# Patient Record
Sex: Female | Born: 1985 | Race: Black or African American | Hispanic: No | Smoking: Never smoker
Health system: Southern US, Community
[De-identification: ages and names within clinical notes are randomized; demographics above are authoritative.]

## PROBLEM LIST (undated history)

## (undated) DIAGNOSIS — Z789 Other specified health status: Secondary | ICD-10-CM

## (undated) HISTORY — PX: HAND SURGERY: SHX662

## (undated) HISTORY — PX: NO PAST SURGERIES: SHX2092

---

## 2011-05-30 ENCOUNTER — Encounter: Payer: Self-pay | Admitting: *Deleted

## 2011-05-30 ENCOUNTER — Emergency Department (HOSPITAL_COMMUNITY): Payer: Self-pay

## 2011-05-30 ENCOUNTER — Emergency Department (HOSPITAL_COMMUNITY)
Admission: EM | Admit: 2011-05-30 | Discharge: 2011-05-30 | Disposition: A | Discharge status: Home / Self Care | Payer: Self-pay | Attending: Emergency Medicine | Admitting: Emergency Medicine

## 2011-05-30 DIAGNOSIS — R109 Unspecified abdominal pain: Secondary | ICD-10-CM | POA: Insufficient documentation

## 2011-05-30 DIAGNOSIS — O200 Threatened abortion: Secondary | ICD-10-CM | POA: Insufficient documentation

## 2011-05-30 DIAGNOSIS — N898 Other specified noninflammatory disorders of vagina: Secondary | ICD-10-CM | POA: Insufficient documentation

## 2011-05-30 LAB — WET PREP, GENITAL: WBC, Wet Prep HPF POC: NONE SEEN

## 2011-05-30 LAB — POCT I-STAT, CHEM 8
Calcium, Ion: 1.23 mmol/L (ref 1.12–1.32)
Chloride: 103 mEq/L (ref 96–112)
Glucose, Bld: 88 mg/dL (ref 70–99)
HCT: 44 % (ref 36.0–46.0)
Hemoglobin: 15 g/dL (ref 12.0–15.0)
TCO2: 24 mmol/L (ref 0–100)

## 2011-05-30 LAB — HCG, QUANTITATIVE, PREGNANCY: hCG, Beta Chain, Quant, S: 2096 m[IU]/mL — ABNORMAL HIGH (ref <5)

## 2011-05-30 LAB — ABO/RH: ABO/RH(D): O POS

## 2011-05-30 NOTE — ED Provider Notes (Signed)
History     CSN: [id number] Arrival date & time: 05/30/2011 12:23 AM   First MD Initiated Contact with Patient 05/30/11 [phone]      Chief Complaint  Patient presents with  . Vaginal Bleeding    pt preports that vaginal bleed started on friday    (Consider location/radiation/quality/duration/timing/severity/associated sxs/prior treatment) Patient is a 25 y.o. female presenting with vaginal bleeding. The history is provided by the patient.  Vaginal Bleeding This is a new problem. The current episode started in the past 7 days (Last normal menstruation was late October and patient reports having a positive pregnancy test. If pregnant she is a G1P0.). The problem occurs constantly. The problem has been gradually improving. Associated symptoms include abdominal pain. Pertinent negatives include no nausea. The symptoms are aggravated by nothing. She has tried nothing for the symptoms.    History reviewed. No pertinent past medical history.  History reviewed. No pertinent past surgical history.  History reviewed. No pertinent family history.  History  Substance Use Topics  . Smoking status: Never Smoker   . Smokeless tobacco: Not on file  . Alcohol Use: No     once a year    OB History    Grav Para Term Preterm Abortions TAB SAB Ect Mult Living   1               Review of Systems  Constitutional: Negative.   Respiratory: Negative.   Cardiovascular: Negative.   Gastrointestinal: Positive for abdominal pain. Negative for nausea.  Genitourinary: Positive for vaginal bleeding and pelvic pain. Negative for dysuria, frequency, flank pain and vaginal discharge.    Allergies  Review of patient's allergies indicates no known allergies.  Home Medications  No current outpatient prescriptions on file.  BP 110/65  Pulse 99  Temp(Src) 97.8 F (36.6 C) (Oral)  Resp 16  SpO2 100%  LMP 04/11/2011  Physical Exam  Constitutional: She is oriented to person, place, and time. She  appears well-developed and well-nourished.  Neck: Normal range of motion.  Pulmonary/Chest: Effort normal.  Abdominal: Soft. Bowel sounds are normal.       Mild tenderness to lower abdomen without guarding.  Genitourinary: There is bleeding around the vagina.       Cervical os is closed. Bleeding with presence of possible POC. Nontender to bimanual exam.  Musculoskeletal: She exhibits no edema.  Neurological: She is alert and oriented to person, place, and time.  Skin: Skin is warm and dry.    ED Course  Procedures (including critical care time)  Labs Reviewed - No data to display No results found.   No diagnosis found.    MDM  US shows no IUC: early preg vs miscarriage vs ectopic. Discussed findings with patient. [facility] and further management.        Rodena Medin, PA 05/30/11 1220

## 2011-05-30 NOTE — ED Notes (Signed)
Pt in c/o vaginal bleeding since yesterday, pt states she is approx 2 months pregnant, states yesterday she was spotting, today she has saturated 3 pads, also c/o cramping abd pain

## 2011-05-30 NOTE — ED Provider Notes (Signed)
Medical screening examination/treatment/procedure(s) were performed by non-physician practitioner and as supervising physician I was immediately available for consultation/collaboration.  Flint Melter, MD 05/30/11 2033

## 2011-05-30 NOTE — ED Notes (Signed)
Currently in US

## 2011-05-30 NOTE — ED Notes (Signed)
Pt reports that s/s started on Friday, states bleeding moderte at this time, started on Friday as minimal bleed, pt changed period pads x3 during the day, and pain at worst 10/10 and at its best 2/10, pt states pain was sharp, constant when symptoms started but now reports minimal cramping, LMP 04/16/2011, pt reports that she had home test done and that she was positive for pregnancy. Pt states 1st pregnancy. Pt denies any other discomfort at this time.

## 2011-05-31 LAB — GC/CHLAMYDIA PROBE AMP, GENITAL
Chlamydia, DNA Probe: NEGATIVE
GC Probe Amp, Genital: NEGATIVE

## 2011-06-01 ENCOUNTER — Encounter (HOSPITAL_COMMUNITY): Payer: Self-pay

## 2011-06-01 ENCOUNTER — Ambulatory Visit (HOSPITAL_COMMUNITY): Payer: Self-pay

## 2011-06-01 ENCOUNTER — Inpatient Hospital Stay (HOSPITAL_COMMUNITY)
Admission: AD | Admit: 2011-06-01 | Discharge: 2011-06-01 | Disposition: A | Discharge status: Home / Self Care | Payer: Self-pay | Source: Ambulatory Visit | Attending: Family Medicine | Admitting: Family Medicine

## 2011-06-01 DIAGNOSIS — O209 Hemorrhage in early pregnancy, unspecified: Secondary | ICD-10-CM | POA: Insufficient documentation

## 2011-06-01 DIAGNOSIS — O4690 Antepartum hemorrhage, unspecified, unspecified trimester: Secondary | ICD-10-CM

## 2011-06-01 NOTE — ED Provider Notes (Signed)
Chart reviewed and agree with management and plan.  

## 2011-06-01 NOTE — Progress Notes (Signed)
Patient states she was seen at [facility] on 11-11. Was instructed to follow up at [facility] today. Patient states she is having no pain but having bleeding more than a period.

## 2011-06-01 NOTE — ED Provider Notes (Signed)
History     Chief Complaint  Patient presents with  . Follow-up    HPIDoreen Opoku25 y.o. presents for followup BHCG.  She was seen at [facility] on 11/11 for bleeding in early pregnancy.   On that date,  BHCG 2096 and ultrasound findings of No IUGS or adnexal mass.  She reports heavier  bleeding today without pain.    No past medical history on file.  No past surgical history on file.  No family history on file.  History  Substance Use Topics  . Smoking status: Never Smoker   . Smokeless tobacco: Not on file  . Alcohol Use: No     once a year    Allergies: No Known Allergies  No prescriptions prior to admission    Review of Systems  Constitutional: Negative.   HENT: Negative.   Gastrointestinal: Negative for abdominal pain.  Genitourinary:       + vaginal bleeding   Physical Exam   Blood pressure 120/73, pulse 81, temperature 98.5 F (36.9 C), temperature source Oral, resp. rate 16, height 5' 2.5" (1.588 m), weight 119 lb 12.8 oz (54.341 kg), last menstrual period 04/11/2011, SpO2 99.00%.  Physical Exam not indicated  Results for orders placed during the hospital encounter of 06/01/11 (from the past 24 hour(s))  HCG, QUANTITATIVE, PREGNANCY     Status: Abnormal   Collection Time   06/01/11  6:24 PM      Component Value Range   hCG, Beta Chain, Quant, S 1730 (*) <5 (mIU/mL)   BLOOD TYPE FROM PREVIOUS RECORD IS 0+  MAU Course  Procedures  MDM Discussed BHCGs with Dr. Shawnie Pons.  Will bring her back in 2 days for repeat BHCG and ectopic precautions  Instructed patient to return to MAU in 48 hrs for repeat bhcg, return sooner if severe abdominal pain and/or heavy vaginal bleeding.    Assessment and Plan  A:  Vaginal bleeding in early pregnancy  P:  Repeat BHCG in 48 hrs. Matt Holmes 06/01/2011, 6:31 PM   Matt Holmes, NP 06/01/11 1911  Matt Holmes, NP 06/01/11 1912

## 2011-06-03 ENCOUNTER — Inpatient Hospital Stay (HOSPITAL_COMMUNITY)
Admission: AD | Admit: 2011-06-03 | Discharge: 2011-06-03 | Disposition: A | Discharge status: Home / Self Care | Payer: Self-pay | Source: Ambulatory Visit | Attending: Obstetrics & Gynecology | Admitting: Obstetrics & Gynecology

## 2011-06-03 DIAGNOSIS — O034 Incomplete spontaneous abortion without complication: Secondary | ICD-10-CM | POA: Insufficient documentation

## 2011-06-03 NOTE — Progress Notes (Signed)
Pt G1 at 7.4wks in for follow up for miscarriage.  Pt denies pain.  Reports bleeding, no clots.

## 2011-06-03 NOTE — ED Provider Notes (Signed)
History     Chief Complaint  Patient presents with  . Follow-up   HPI 25 y.o. G1P0 here for f/u quant. Seen at [facility] on 11/11 with vaginal bleeding and + UPT, quant 2096, no IUP, no adnexal mass on u/s. Here for quant on 11/13, decreased to 1730. Still having bleeding, somewhat lighter now, no pain.     No past medical history on file.  No past surgical history on file.  No family history on file.  History  Substance Use Topics  . Smoking status: Never Smoker   . Smokeless tobacco: Not on file  . Alcohol Use: No     once a year    Allergies: No Known Allergies  No prescriptions prior to admission    Review of Systems  Gastrointestinal: Negative for abdominal pain.  Genitourinary:       + bleeding    Physical Exam   Blood pressure 106/69, pulse 76, temperature 98.1 F (36.7 C), temperature source Oral, resp. rate 16, height 5\' 2"  (1.575 m), weight 54.885 kg (121 lb), last menstrual period 04/11/2011.  Physical Exam  Nursing note and vitals reviewed. Constitutional: She is oriented to person, place, and time. She appears well-developed and well-nourished. No distress.  Cardiovascular: Normal rate.   Respiratory: Effort normal.  Neurological: She is alert and oriented to person, place, and time.  Skin: Skin is warm and dry.  Psychiatric: She has a normal mood and affect.    MAU Course  Procedures  Results for orders placed during the hospital encounter of 06/03/11 (from the past 24 hour(s))  HCG, QUANTITATIVE, PREGNANCY     Status: Abnormal   Collection Time   06/03/11 11:00 PM      Component Value Range   hCG, Beta Chain, Quant, S 1644 (*) <5 (mIU/mL)     Assessment and Plan  HCG continuing to decrease, repeat in 2 days, rev'd precautions Consulted with Dr. Debroah Loop, agrees with plan   [facility] 06/04/2011, 2:12 AM

## 2011-06-06 ENCOUNTER — Ambulatory Visit (HOSPITAL_COMMUNITY)
Admit: 2011-06-06 | Discharge: 2011-06-06 | Disposition: A | Discharge status: Home / Self Care | Payer: Self-pay | Attending: Obstetrics & Gynecology | Admitting: Obstetrics & Gynecology

## 2011-06-06 ENCOUNTER — Inpatient Hospital Stay (HOSPITAL_COMMUNITY)
Admission: AD | Admit: 2011-06-06 | Discharge: 2011-06-06 | Disposition: A | Discharge status: Home / Self Care | Payer: Self-pay | Source: Ambulatory Visit | Attending: Obstetrics & Gynecology | Admitting: Obstetrics & Gynecology

## 2011-06-06 ENCOUNTER — Inpatient Hospital Stay (HOSPITAL_COMMUNITY): Payer: Self-pay

## 2011-06-06 DIAGNOSIS — O009 Unspecified ectopic pregnancy without intrauterine pregnancy: Secondary | ICD-10-CM | POA: Diagnosis present

## 2011-06-06 DIAGNOSIS — O00109 Unspecified tubal pregnancy without intrauterine pregnancy: Secondary | ICD-10-CM | POA: Insufficient documentation

## 2011-06-06 LAB — CBC
HCT: 39 % (ref 36.0–46.0)
MCH: 30.4 pg (ref 26.0–34.0)
MCV: 89.9 fL (ref 78.0–100.0)
RDW: 12.5 % (ref 11.5–15.5)
WBC: 5.9 10*3/uL (ref 4.0–10.5)

## 2011-06-06 LAB — CREATININE, SERUM: Creatinine, Ser: 0.65 mg/dL (ref 0.50–1.10)

## 2011-06-06 MED ORDER — METHOTREXATE INJECTION FOR WOMEN'S HOSPITAL
50.0000 mg/m2 | Freq: Once | INTRAMUSCULAR | Status: AC
  Administered 2011-06-06: 80 mg via INTRAMUSCULAR
  Filled 2011-06-06: qty 1.6

## 2011-06-06 NOTE — ED Provider Notes (Signed)
Agree with above note.  Lindzie Boxx H. 06/06/2011 9:24 PM

## 2011-06-06 NOTE — Progress Notes (Signed)
Bleeding stopped yesterday still having a little bit of abdominal pain, here for BHCG

## 2011-06-06 NOTE — ED Provider Notes (Signed)
History     Chief Complaint  Patient presents with  . Abdominal Pain   Abdominal Pain   25 y.o. G1P0 here for f/u quant. Seen at [facility] on 11/11 with vaginal bleeding and + UPT, quant 2096, no IUP, no adnexal mass on u/s. Here for quant on 11/13, decreased to 1730, then 1644 on 11/15. Today bleeding has stopped, but still having some mild low abd pain.    No past medical history on file.  No past surgical history on file.  No family history on file.  History  Substance Use Topics  . Smoking status: Never Smoker   . Smokeless tobacco: Not on file  . Alcohol Use: No     once a year    Allergies: No Known Allergies  No prescriptions prior to admission    Review of Systems  Gastrointestinal: Positive for abdominal pain.  Genitourinary:       Negative for bleeding    Physical Exam   Blood pressure 106/69, pulse 91, temperature 99 F (37.2 C), temperature source Oral, resp. rate 16, height 5' 3.5" (1.613 m), weight 53.524 kg (118 lb), last menstrual period 04/11/2011.  Physical Exam  Nursing note and vitals reviewed. Constitutional: She is oriented to person, place, and time. She appears well-developed and well-nourished. No distress.  Cardiovascular: Normal rate.   Respiratory: Effort normal.  Neurological: She is alert and oriented to person, place, and time.  Skin: Skin is warm and dry.  Psychiatric: She has a normal mood and affect.    MAU Course  Procedures   Results for orders placed during the hospital encounter of 06/06/11 (from the past 24 hour(s))  HCG, QUANTITATIVE, PREGNANCY     Status: Abnormal   Collection Time   06/06/11 12:39 PM      Component Value Range   hCG, Beta Chain, Quant, S 1257 (*) <5 (mIU/mL)  CBC     Status: Normal   Collection Time   06/06/11 12:39 PM      Component Value Range   WBC 5.9  4.0 - 10.5 (K/uL)   RBC 4.34  3.87 - 5.11 (MIL/uL)   Hemoglobin 13.2  12.0 - 15.0 (g/dL)   HCT 78.2  95.6 - 21.3 (%)   MCV 89.9  78.0 -  100.0 (fL)   MCH 30.4  26.0 - 34.0 (pg)   MCHC 33.8  30.0 - 36.0 (g/dL)   RDW 08.6  57.8 - 46.9 (%)   Platelets 274  150 - 400 (K/uL)   Consulted with Dr. Penne Lash, will get repeat u/s to verify no ectopic pregnancy.   U/S: 2.5 cm right adnexal mass suspicious for ectopic, no IUP   Assessment and Plan  25 y.o. G1P0 with right ectopic pregnancy Methotrexate today, f/u per protocol, precautions rev'd  Iriel Nason 06/06/2011, 2:36 PM

## 2011-06-09 ENCOUNTER — Inpatient Hospital Stay (HOSPITAL_COMMUNITY)
Admission: AD | Admit: 2011-06-09 | Discharge: 2011-06-09 | Disposition: A | Discharge status: Home / Self Care | Payer: Self-pay | Source: Ambulatory Visit | Attending: Obstetrics & Gynecology | Admitting: Obstetrics & Gynecology

## 2011-06-09 ENCOUNTER — Encounter (HOSPITAL_COMMUNITY): Payer: Self-pay | Admitting: *Deleted

## 2011-06-09 DIAGNOSIS — O00109 Unspecified tubal pregnancy without intrauterine pregnancy: Secondary | ICD-10-CM | POA: Insufficient documentation

## 2011-06-09 DIAGNOSIS — O009 Unspecified ectopic pregnancy without intrauterine pregnancy: Secondary | ICD-10-CM

## 2011-06-09 HISTORY — DX: Other specified health status: Z78.9

## 2011-06-09 NOTE — ED Provider Notes (Signed)
History   Pt presents today for B-quant hcg on day 4 s/p methotrexate for ectopic preg. She states she is doing well and has no complaints. She denies abd pain, vag dc, bleeding, or any other sx at this time.  No chief complaint on file.  HPI  OB History    Grav Para Term Preterm Abortions TAB SAB Ect Mult Living   1               Past Medical History  Diagnosis Date  . No pertinent past medical history     Past Surgical History  Procedure Date  . No past surgeries     Family History  Problem Relation Age of Onset  . Anesthesia problems Neg Hx     History  Substance Use Topics  . Smoking status: Never Smoker   . Smokeless tobacco: Never Used  . Alcohol Use: No     once a year    Allergies: No Known Allergies  No prescriptions prior to admission    Review of Systems  Constitutional: Negative for fever.  Cardiovascular: Negative for chest pain.  Gastrointestinal: Negative for nausea, vomiting, abdominal pain, diarrhea and constipation.  Genitourinary: Negative for dysuria, urgency, frequency and hematuria.  Neurological: Negative for dizziness and headaches.  Psychiatric/Behavioral: Negative for depression and suicidal ideas.   Physical Exam   Blood pressure 121/84, pulse 85, temperature 98.2 F (36.8 C), temperature source Oral, resp. rate 18, last menstrual period 04/11/2011, SpO2 99.00%.  Physical Exam  Nursing note and vitals reviewed. Constitutional: She is oriented to person, place, and time. She appears well-developed and well-nourished. No distress.  HENT:  Head: Normocephalic and atraumatic.  Eyes: EOM are normal. Pupils are equal, round, and reactive to light.  GI: Soft. She exhibits no distension. There is no tenderness. There is no rebound and no guarding.  Neurological: She is alert and oriented to person, place, and time.  Skin: Skin is warm and dry. She is not diaphoretic.  Psychiatric: Her behavior is normal. Judgment and thought content  normal.    MAU Course  Procedures  Results for orders placed during the hospital encounter of 06/09/11 (from the past 24 hour(s))  HCG, QUANTITATIVE, PREGNANCY     Status: Abnormal   Collection Time   06/09/11 12:43 PM      Component Value Range   hCG, Beta Chain, Quant, S 658 (*) <5 (mIU/mL)     Assessment and Plan  Ectopic preg: discussed with pt at length. She will return on 06/12/11 for day 7 B-quant hcg. Discussed signs and sx of ruptured ectopic at length. Discussed diet, activity, risks, and precautions.  Clinton Gallant. Khaliya Golinski III, DrHSc, MPAS, PA-C  06/09/2011, 1:24 PM   Henrietta Hoover, PA 06/09/11 1329

## 2011-06-12 ENCOUNTER — Encounter (HOSPITAL_COMMUNITY): Payer: Self-pay | Admitting: *Deleted

## 2011-06-12 ENCOUNTER — Inpatient Hospital Stay (HOSPITAL_COMMUNITY)
Admission: AD | Admit: 2011-06-12 | Discharge: 2011-06-12 | Disposition: A | Discharge status: Home / Self Care | Payer: Self-pay | Source: Ambulatory Visit | Attending: Obstetrics and Gynecology | Admitting: Obstetrics and Gynecology

## 2011-06-12 DIAGNOSIS — O00109 Unspecified tubal pregnancy without intrauterine pregnancy: Secondary | ICD-10-CM | POA: Insufficient documentation

## 2011-06-12 DIAGNOSIS — O009 Unspecified ectopic pregnancy without intrauterine pregnancy: Secondary | ICD-10-CM

## 2011-06-12 NOTE — Progress Notes (Signed)
Pt states, " I am here for my recheck on a threatened miscarriage. My bleeding is less and I do not have pain."

## 2011-06-12 NOTE — ED Provider Notes (Signed)
Agree with above note.  Tobias Avitabile 06/12/2011 10:39 PM

## 2011-06-12 NOTE — ED Provider Notes (Signed)
History     Chief Complaint  Patient presents with  . Follow-up   HPI  Pt is here day #7 status post methotrexate on 06/06/11.  Pt denies abdominal pain; +slight bleeding.  No questions or concerns.    Past Medical History  Diagnosis Date  . No pertinent past medical history     Past Surgical History  Procedure Date  . No past surgeries     Family History  Problem Relation Age of Onset  . Anesthesia problems Neg Hx     History  Substance Use Topics  . Smoking status: Never Smoker   . Smokeless tobacco: Never Used  . Alcohol Use: No     once a year    Allergies: No Known Allergies  No prescriptions prior to admission    Review of Systems  Genitourinary:       Vaginal bleeding  All other systems reviewed and are negative.   Physical Exam   Blood pressure 103/59, pulse 79, temperature 98.8 F (37.1 C), temperature source Oral, resp. rate 20, height 5' 2.25" (1.581 m), weight 53.071 kg (117 lb), last menstrual period 04/11/2011, SpO2 99.00%.  Physical Exam  Constitutional: She is oriented to person, place, and time. She appears well-developed and well-nourished. No distress.  HENT:  Head: Normocephalic.  Neck: Normal range of motion. Neck supple.  Neurological: She is alert and oriented to person, place, and time. She has normal reflexes.  Skin: Skin is warm and dry.    MAU Course  Procedures BHCG 285  Assessment and Plan  Ectopic s/p Methotrexate  Plan: DC to home Return in one week for repeat of BHCG  [facility] 06/12/2011, 9:20 PM

## 2011-06-19 ENCOUNTER — Inpatient Hospital Stay (HOSPITAL_COMMUNITY)
Admission: AD | Admit: 2011-06-19 | Discharge: 2011-06-19 | Disposition: A | Discharge status: Home / Self Care | Payer: Self-pay | Source: Ambulatory Visit | Attending: Obstetrics and Gynecology | Admitting: Obstetrics and Gynecology

## 2011-06-19 ENCOUNTER — Ambulatory Visit (HOSPITAL_COMMUNITY): Payer: Self-pay

## 2011-06-19 DIAGNOSIS — Z3400 Encounter for supervision of normal first pregnancy, unspecified trimester: Secondary | ICD-10-CM

## 2011-06-19 DIAGNOSIS — O00109 Unspecified tubal pregnancy without intrauterine pregnancy: Secondary | ICD-10-CM | POA: Insufficient documentation

## 2011-06-19 NOTE — Progress Notes (Signed)
Patient to MAU for repeat BHCG. States she is not having any pain or spotting.

## 2011-07-08 ENCOUNTER — Inpatient Hospital Stay (HOSPITAL_COMMUNITY)
Admission: AD | Admit: 2011-07-08 | Discharge: 2011-07-09 | Disposition: A | Discharge status: Home / Self Care | Payer: Self-pay | Source: Ambulatory Visit | Attending: Obstetrics & Gynecology | Admitting: Obstetrics & Gynecology

## 2011-07-08 DIAGNOSIS — Z8742 Personal history of other diseases of the female genital tract: Secondary | ICD-10-CM

## 2011-07-08 DIAGNOSIS — O00109 Unspecified tubal pregnancy without intrauterine pregnancy: Secondary | ICD-10-CM | POA: Insufficient documentation

## 2011-07-08 DIAGNOSIS — Z8759 Personal history of other complications of pregnancy, childbirth and the puerperium: Secondary | ICD-10-CM

## 2011-07-08 NOTE — ED Provider Notes (Signed)
History     No chief complaint on file.  HPI 25 y.o. G1P0 here for f/u quant. Diagnosed with ectopic pregnancy and treated with methotrexate on 11/11. Quant decreased to 29 on 12/1, states she was told to come back today for f/u. No pain or bleeding.     Past Medical History  Diagnosis Date  . No pertinent past medical history     Past Surgical History  Procedure Date  . No past surgeries     Family History  Problem Relation Age of Onset  . Anesthesia problems Neg Hx     History  Substance Use Topics  . Smoking status: Never Smoker   . Smokeless tobacco: Never Used  . Alcohol Use: No     once a year    Allergies: No Known Allergies  No prescriptions prior to admission    Review of Systems  Constitutional: Negative.   Respiratory: Negative.   Cardiovascular: Negative.   Gastrointestinal: Negative for nausea, vomiting, abdominal pain, diarrhea and constipation.  Genitourinary: Negative for dysuria, urgency, frequency, hematuria and flank pain.       Negative for vaginal bleeding, vaginal discharge  Musculoskeletal: Negative.   Neurological: Negative.   Psychiatric/Behavioral: Negative.    Physical Exam   Last menstrual period 04/11/2011.  Physical Exam  Nursing note and vitals reviewed. Constitutional: She is oriented to person, place, and time. No distress.  Neurological: She is alert and oriented to person, place, and time.  Psychiatric: She has a normal mood and affect.    MAU Course  Procedures  Results for orders placed during the hospital encounter of 07/08/11 (from the past 24 hour(s))  HCG, QUANTITATIVE, PREGNANCY     Status: Normal   Collection Time   07/08/11 11:45 PM      Component Value Range   hCG, Beta Chain, Quant, S <1  <5 (mIU/mL)     Assessment and Plan  S/P ectopic pregnancy with MTX treatment No further f/u needed  Anna Wolfe 07/08/2011, 11:29 PM

## 2011-07-08 NOTE — Progress Notes (Signed)
PT SAYS SHE IS FOLLOWUP FOR METHOTREXATE-   NO BLEEDING - NO PAIN.  WAS SUPPOSE TO COME IN ON Sunday- BUT WAS OUT OF TOWN.

## 2011-07-20 NOTE — ED Provider Notes (Signed)
Agree with above note.  Anna Wolfe H. 07/20/2011 9:21 PM

## 2012-08-30 IMAGING — US US OB COMP LESS 14 WK
1 series · 13 of 28 positions shown · non-contrast
Comparison: None.

CLINICAL DATA: Pregnant, bleeding, beta HCG 6938, 7 weeks 3 days by
LMP

OBSTETRIC <14 WK US AND TRANSVAGINAL OB US
TECHNIQUE: Both transabdominal and transvaginal ultrasound
examinations were performed for complete evaluation of the
gestation as well as the maternal uterus, adnexal regions, and
pelvic cul-de-sac.  Transvaginal technique was performed to assess
early pregnancy.

[Series 1: us ob comp less 14 wk · 0.28mm/px · 13 of 45 slices shown]
[im 2/45]
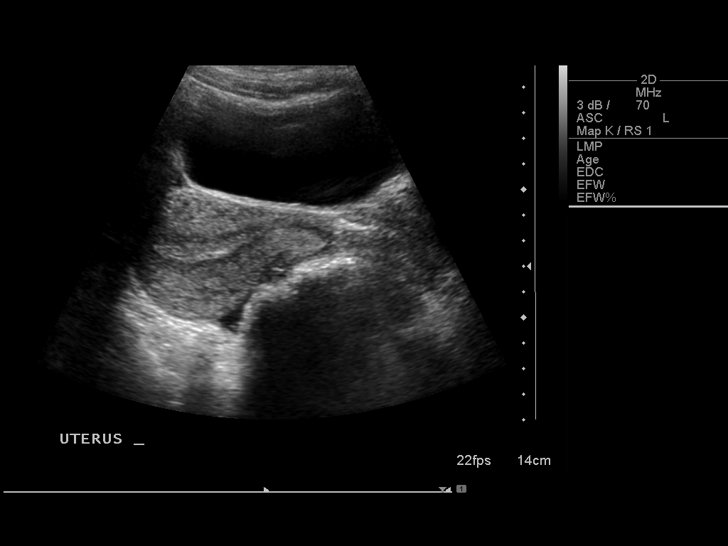
[im 5/45]
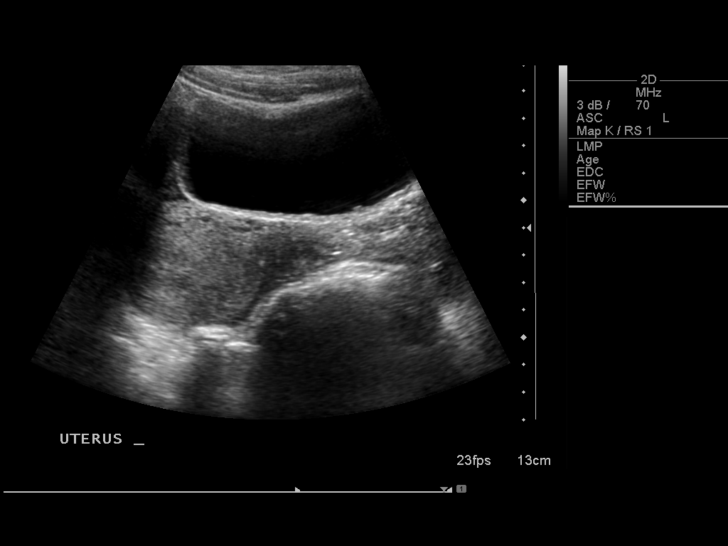
[im 9/45]
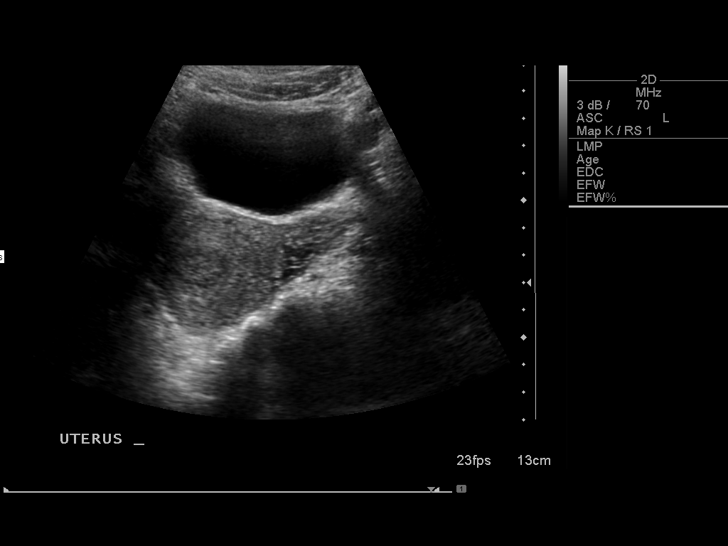
[im 12/45]
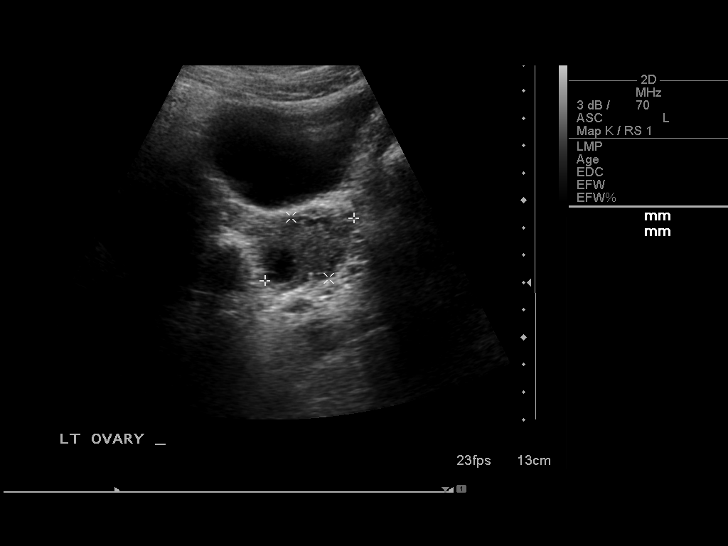
[im 15/45]
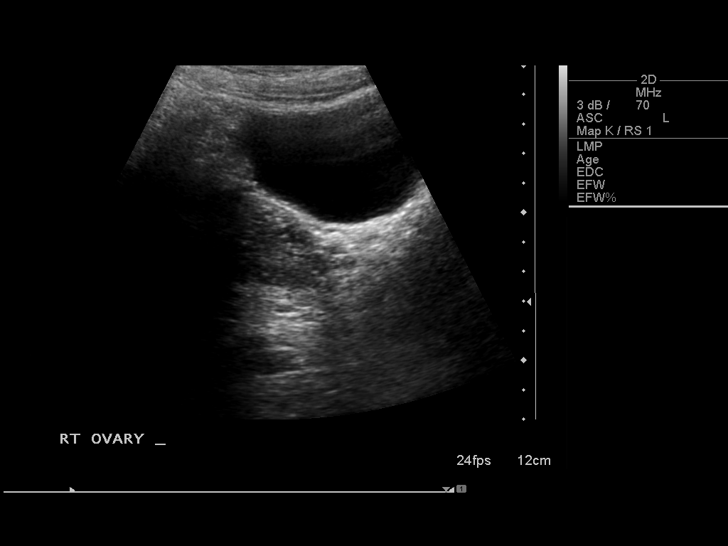
[im 18/45]
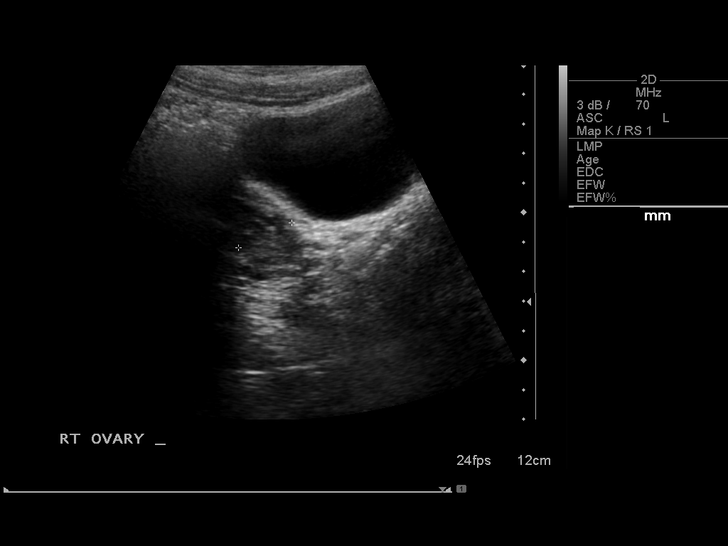
[im 23/45]
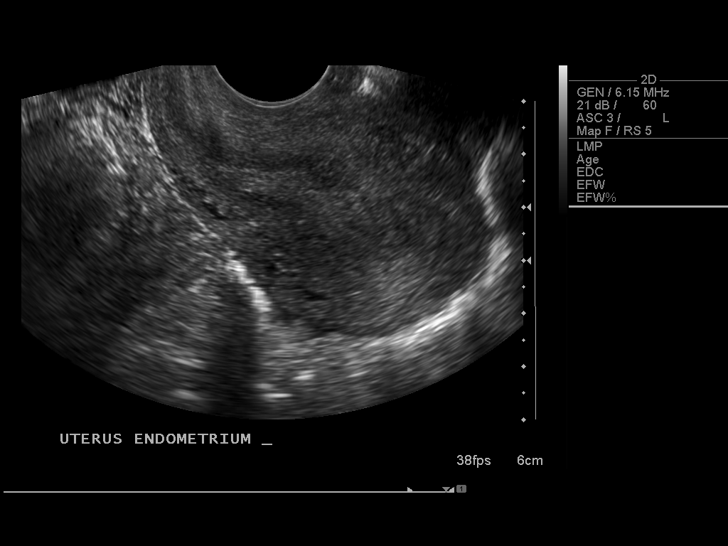
[im 27/45]
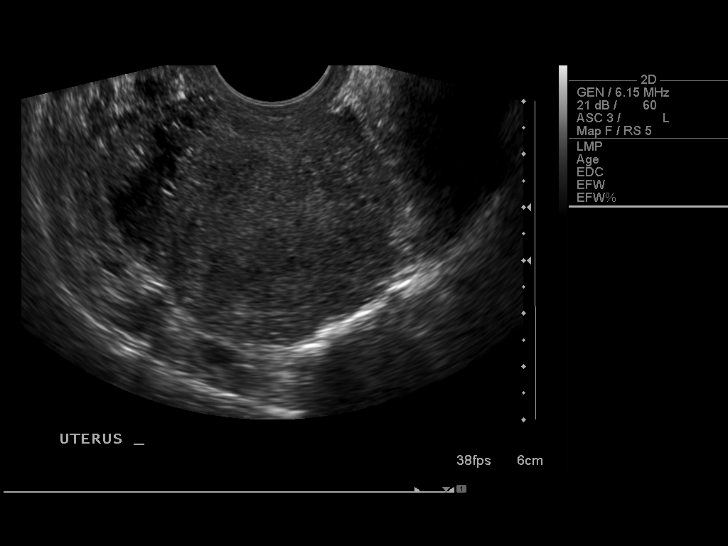
[im 30/45]
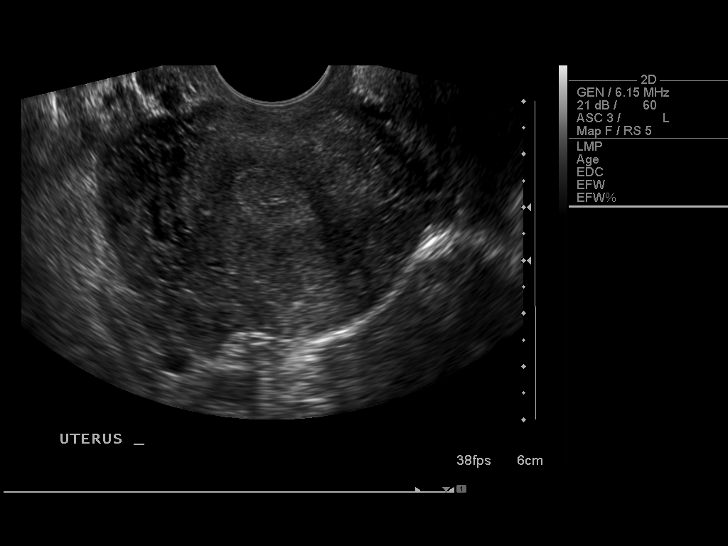
[im 33/45]
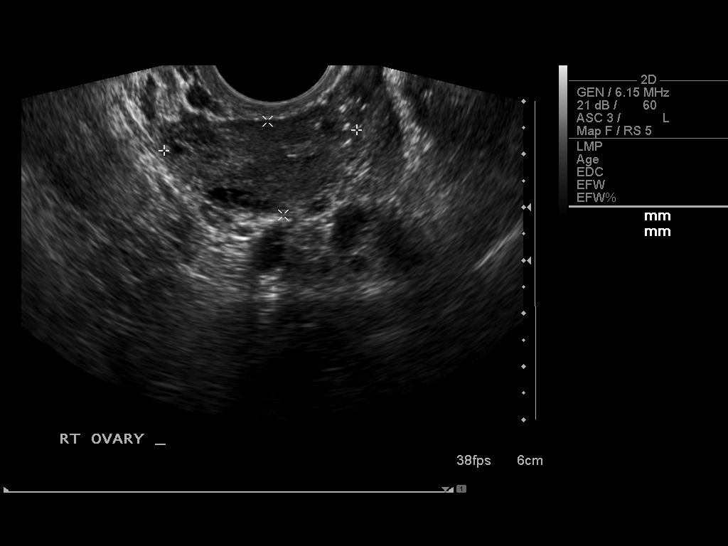
[im 36/45]
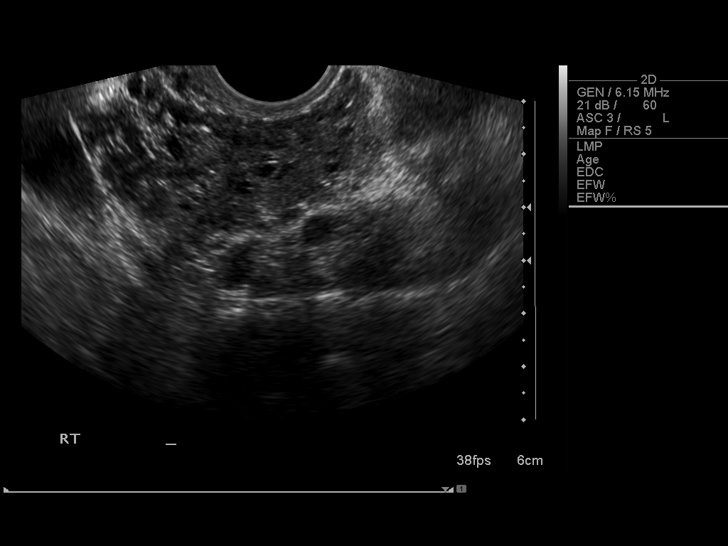
[im 40/45]
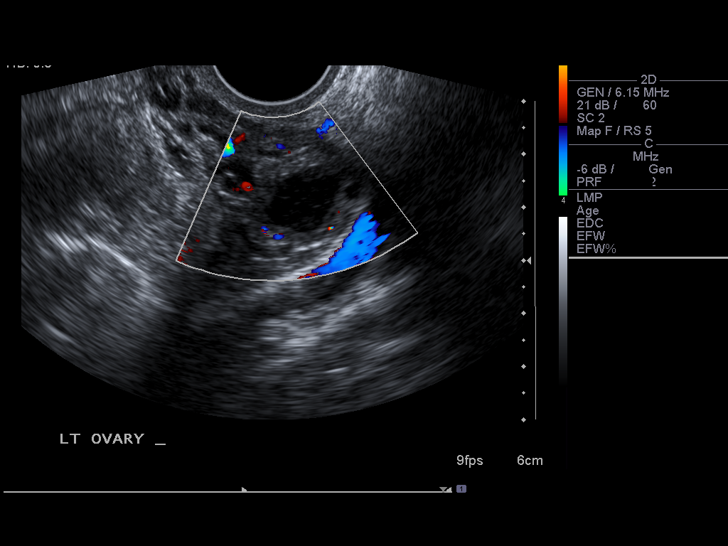
[im 43/45]
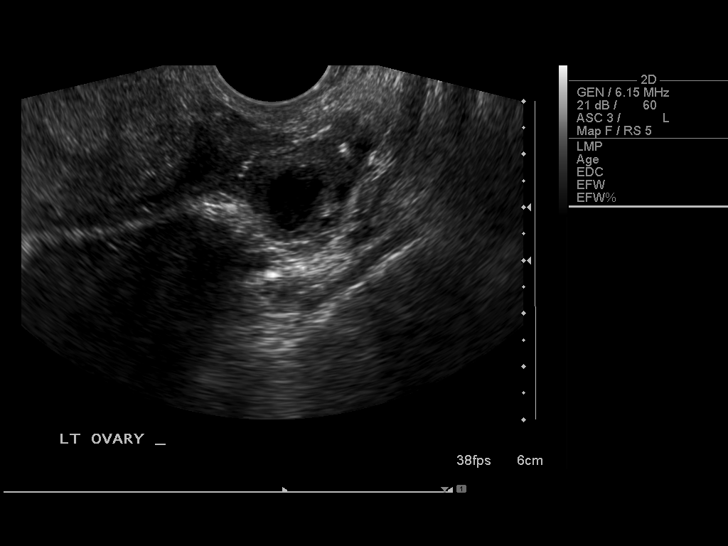

[13 of 28 positions shown; findings below may reference images not displayed]

Intrauterine gestational sac:  Not visualized.
Yolk sac: Not visualized.
Embryo: Not visualized.

Maternal uterus/adnexae:
Endometrium is heterogeneous and measures 12 mm in thickness.

Right ovary measures 1.8 x 2.0 x 3.7 cm and is within normal
limits.

Left ovary measures 3.7 x 1.7 x 1.7 cm and is notable for a corpus
luteal cyst.

No adnexal masses are seen.

Small volume pelvic fluid.
IMPRESSION: No intrauterine gestational sac is visualized.  Endometrium
measures 12 mm.  No adnexal mass is seen.

By definition, this reflects a pregnancy of unknown location.
Differential considerations include early normal IUP, abnormal IUP,
or nonvisualized ectopic pregnancy.  Given the clinical history and
beta HCG, this is favored to reflect an abnormal IUP.  However,
serial beta HCG (supplemented by repeat sonography as clinically
warranted) is suggested.

Suspected corpus luteal cyst on the left.

## 2012-09-06 IMAGING — US US OB TRANSVAGINAL
1 series · 14 of 28 positions shown · non-contrast
Comparison: none

[Series 1: us ob transvaginal · 37 acquisitions, 14 frames shown]
[im 2/37]
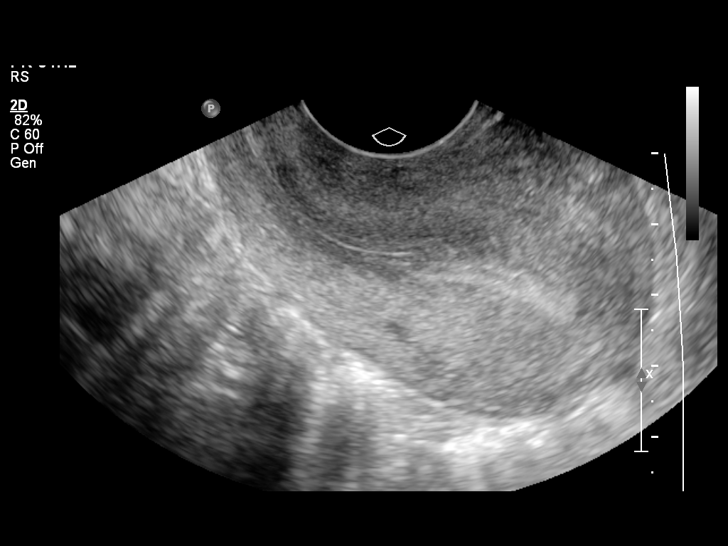
[im 5/37]
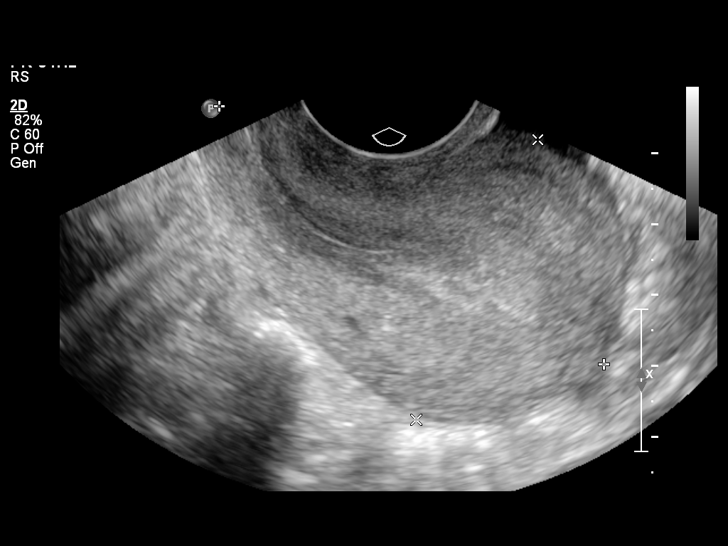
[im 7/37]
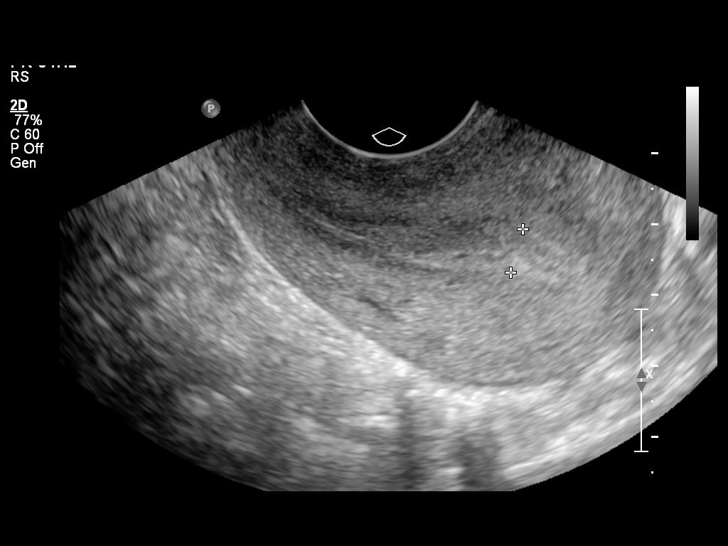
[im 10/37]
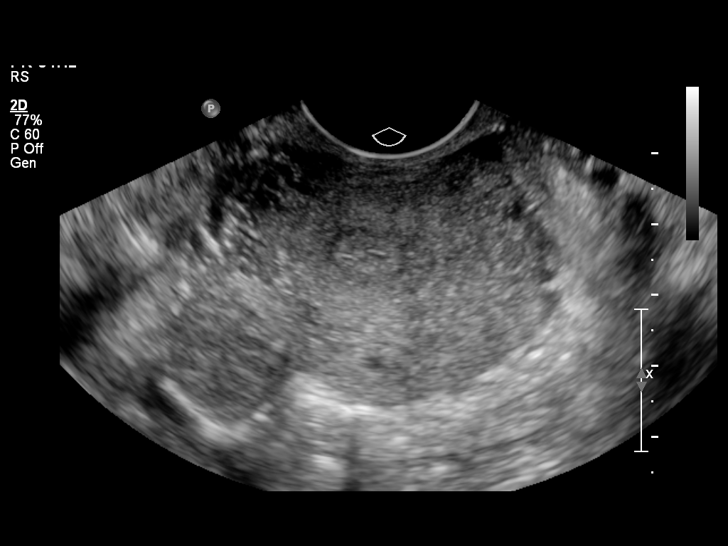
[im 13/37]
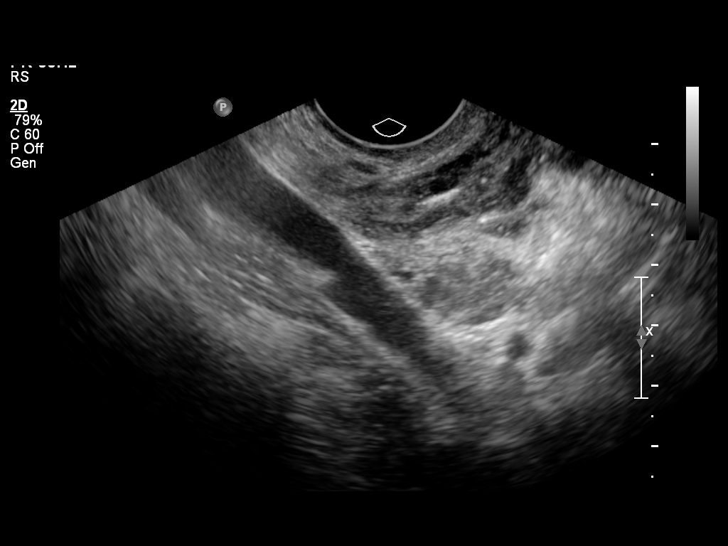
[im 15/37]
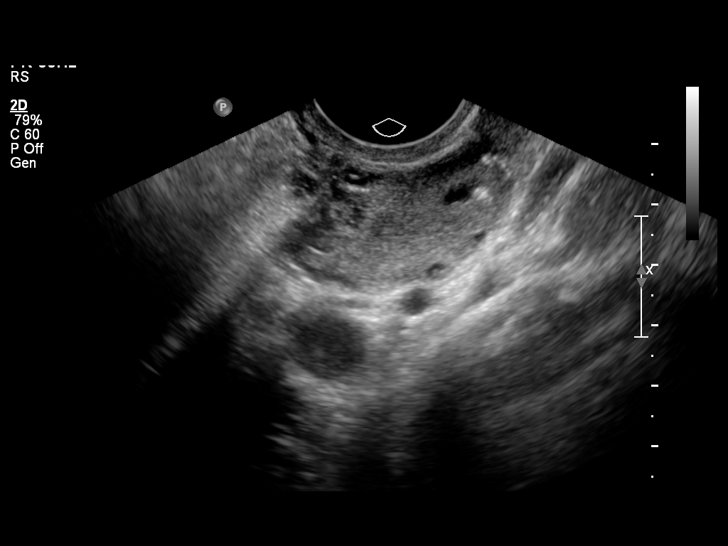
[im 18/37]
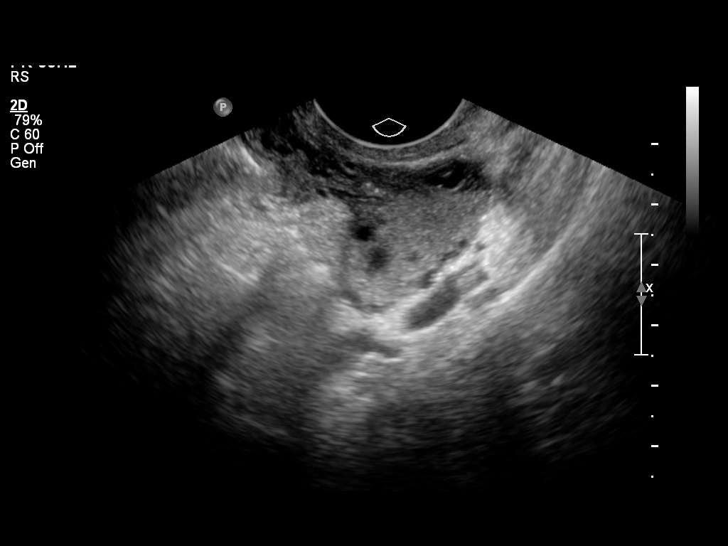
[im 21/37]
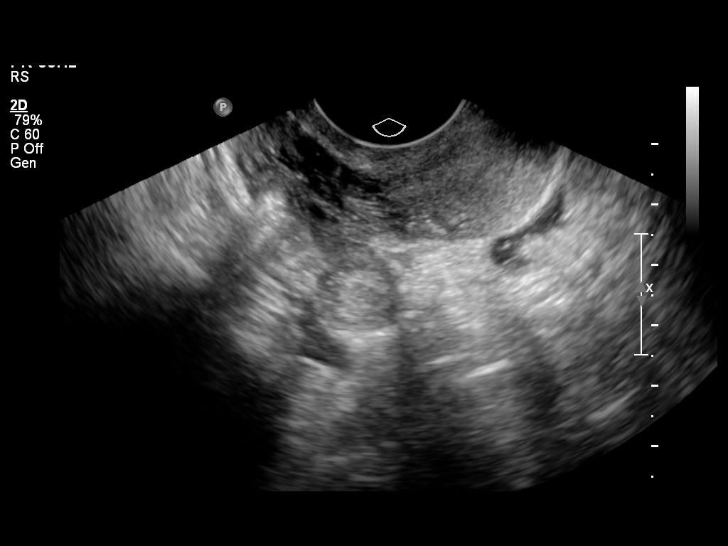
[im 23/37]
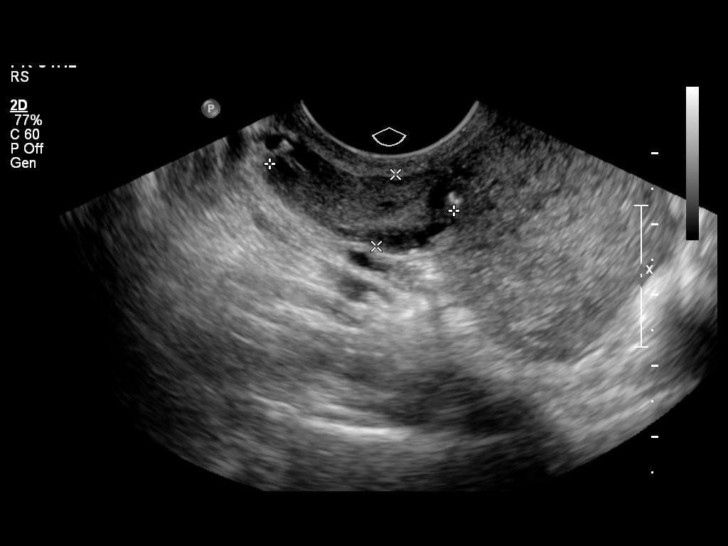
[im 26/37]
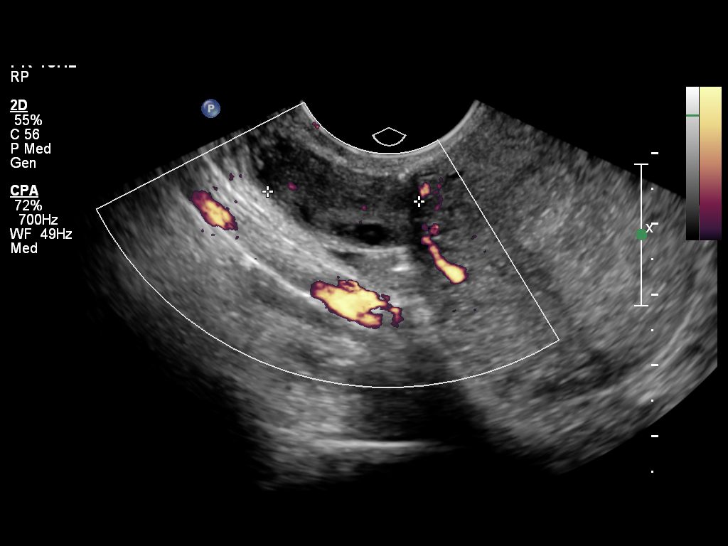
[im 29/37]
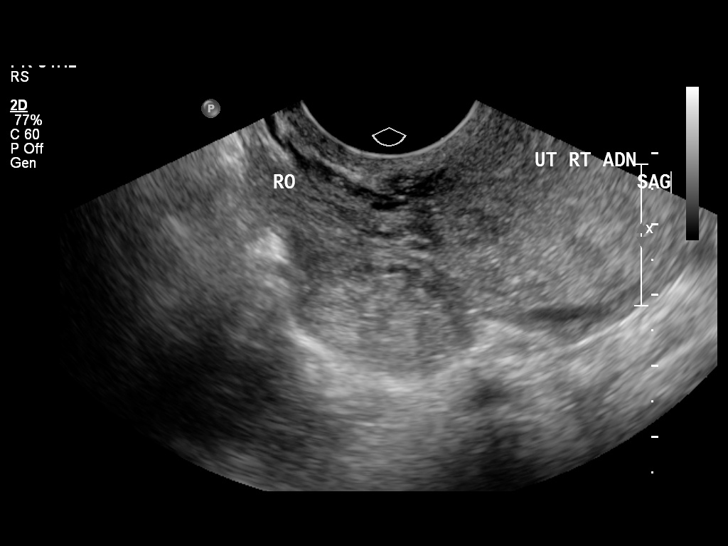
[im 31/37]
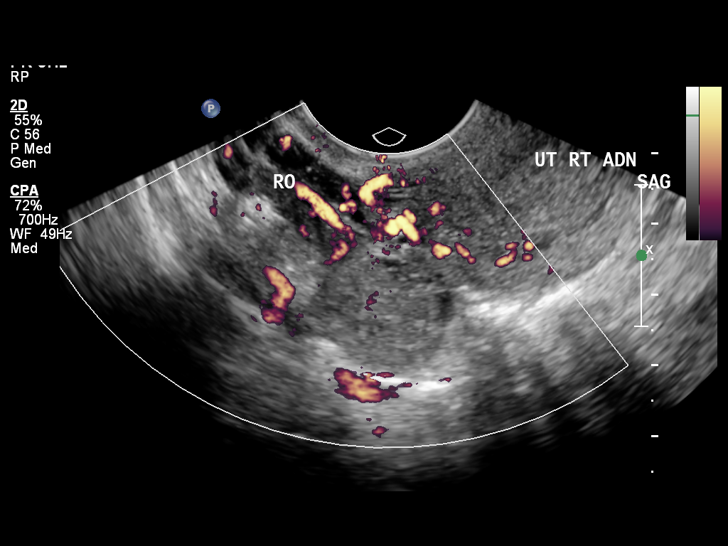
[im 34/37]
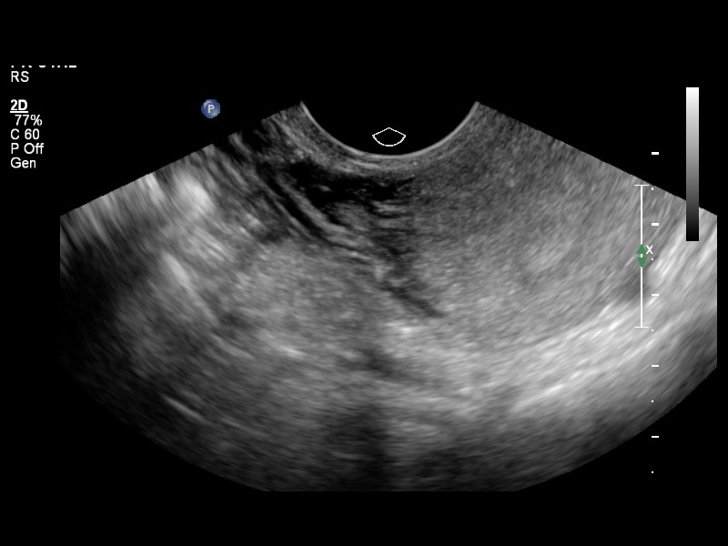
[im 37/37]
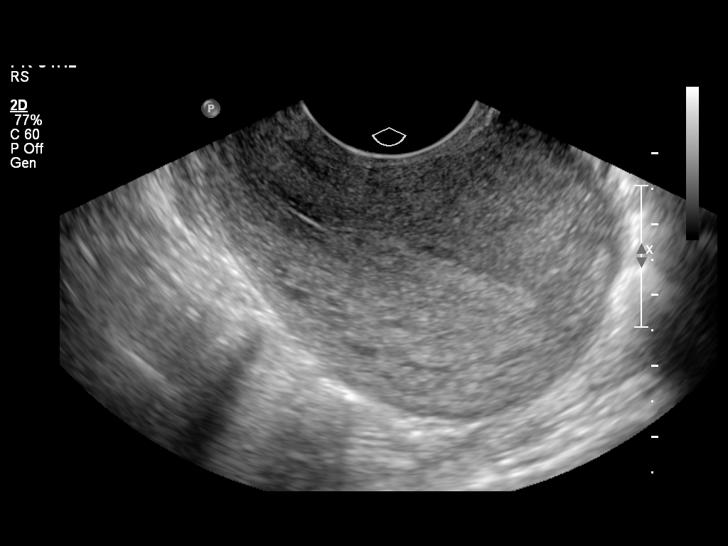

[14 of 28 positions shown; findings below may reference images not displayed]

OBSTETRICS REPORT
                      (Signed Final 06/06/2011 [DATE])

Procedures

 US OB TRANSVAGINAL                                    76817.0
Indications

 Abnormal rising quants
 Pain - RLQ
Fetal Evaluation

 Cardiac Activity:  No embryo visualized
Cervix Uterus Adnexa

 Cervix:       Closed.

 Left Ovary:    Within normal limits.
 Right Ovary:   Within normal limits.

 Adnexa:     Adnexal mass measuring 6.1x5.3x5.0cm (no internal
             gestational sac, yolk sac, or fetal pole identified)
Impression

 2.5cm right adnexal mass has developed since the prior exam
 7 days ago and no IUP is seen. This is highly suspicious for
 ectopic pregnancy, less likely IUP still too early to visualize or
 missed abortion. Called to [facility] in [place] by Dr. Adamo
 06/06/11 at 044pm.

 questions or concerns.

## 2014-05-20 ENCOUNTER — Encounter (HOSPITAL_COMMUNITY): Payer: Self-pay | Admitting: *Deleted

## 2020-04-17 ENCOUNTER — Ambulatory Visit: Payer: Self-pay | Admitting: Family Medicine

## 2020-05-06 ENCOUNTER — Ambulatory Visit (HOSPITAL_COMMUNITY)
Admission: EM | Admit: 2020-05-06 | Discharge: 2020-05-06 | Disposition: A | Discharge status: Home / Self Care | Payer: Self-pay

## 2020-05-06 ENCOUNTER — Encounter (HOSPITAL_COMMUNITY): Payer: Self-pay

## 2020-05-06 ENCOUNTER — Other Ambulatory Visit: Payer: Self-pay

## 2020-05-06 DIAGNOSIS — S61216A Laceration without foreign body of right little finger without damage to nail, initial encounter: Secondary | ICD-10-CM | POA: Diagnosis present

## 2020-05-06 NOTE — Discharge Instructions (Addendum)
Keep the cut cleaned with soap and water. Otherwise keep covered with a bandaid and Neosporin or other triple antibiotic topical.   Should you develop thick yellow purulent drainage from the site or surround redness with warmth please seek emergency medical attention.   Please follow up with your primary care provider.    Peggyann Shoals, DO [facility]

## 2020-05-06 NOTE — ED Provider Notes (Signed)
[facility]    CSN: [id number] Arrival date & time: 05/06/20  5366      History   Chief Complaint Chief Complaint  Patient presents with  . Finger Injury    HPI Anna Wolfe is a 34 y.o. female presenting to urgent care with concerns that she scraped her knuckle against a piece of glass yesterday. She would put pressure on the wound with a paper towel and then remove it to see if its still bleeding an did this on and off without persistently applying pressure. She is concerned that the bleeding caused her to have a headache which was relieved with Ibuprofen. No numbness, tingling, fatigue, vision changes, chest pain, or shortness of breath.   HPI  Past Medical History:  Diagnosis Date  . No pertinent past medical history     Patient Active Problem List   Diagnosis Date Noted  . Laceration of right little finger without foreign body without damage to nail 05/06/2020  . Ectopic pregnancy 06/06/2011    Past Surgical History:  Procedure Laterality Date  . HAND SURGERY    . NO PAST SURGERIES      OB History    Gravida  1   Para      Term      Preterm      AB      Living        SAB      TAB      Ectopic      Multiple      Live Births               Home Medications    Prior to Admission medications   Not on File    Family History Family History  Problem Relation Age of Onset  . Healthy Mother   . Anesthesia problems Neg Hx     Social History Social History   Tobacco Use  . Smoking status: Never Smoker  . Smokeless tobacco: Never Used  Substance Use Topics  . Alcohol use: No    Comment: once a year  . Drug use: No     Allergies   Patient has no known allergies.   Review of Systems Review of Systems-see HPI   Physical Exam Triage Vital Signs ED Triage Vitals  Enc Vitals Group     BP 05/06/20 0844 121/86     Pulse Rate 05/06/20 0844 88     Resp 05/06/20 0844 18     Temp 05/06/20 0844 98.3 F (36.8 C)      Temp Source 05/06/20 0844 Oral     SpO2 05/06/20 0844 99 %     Weight --      Height --      Head Circumference --      Peak Flow --      Pain Score 05/06/20 0840 5     Pain Loc --      Pain Edu? --      Excl. in GC? --    No data found.  Updated Vital Signs BP 121/86 (BP Location: Left Arm)   Pulse 88   Temp 98.3 F (36.8 C) (Oral)   Resp 18   LMP 04/27/2020 (Approximate)   SpO2 99%   Breastfeeding Unknown   Physical exam: General: well appearing, very anxious Respiratory: CTA bilaterally Extremity: see photo below (67mmx2mm shallow cut appreciated, no bleeding)      UC Treatments / Results  Labs (all labs ordered are listed,  but only abnormal results are displayed) Labs Reviewed - No data to display  EKG   Radiology No results found.  Procedures Procedures (including critical care time)  Medications Ordered in UC Medications - No data to display  Initial Impression / Assessment and Plan / UC Course  I have reviewed the triage vital signs and the nursing notes.  Pertinent labs & imaging results that were available during my care of the patient were reviewed by me and considered in my medical decision making (see chart for details).   Superficial cut to finger: hemostasis achieved, no foreign body appreciated, no evidence of infection. -Keep covered with bandage and neosporin -Strict return precautions given   Final Clinical Impressions(s) / UC Diagnoses   Final diagnoses:  Laceration of right little finger without foreign body without damage to nail, initial encounter     Discharge Instructions     Keep the cut cleaned with soap and water. Otherwise keep covered with a bandaid and Neosporin or other triple antibiotic topical.   Should you develop thick yellow purulent drainage from the site or surround redness with warmth please seek emergency medical attention.   Please follow up with your primary care provider.    Peggyann Shoals, DO [facility]       ED Prescriptions    None     PDMP not reviewed this encounter.   Peggyann Shoals, DO [facility], PGY-3 05/06/2020 9:28 AM    Dollene Cleveland, DO 05/06/20 [id number]

## 2020-05-06 NOTE — ED Triage Notes (Signed)
Patient in with c/o laceration to right knuckle that happened yesterday while at work. States she reached her hand through glass door that was cracked then she cut it. Also c/o headache that started after cutting her knuckle.  Patient took ibuprofen yesterday for pain with some relief  Denies numbness and tingling to area

## 2020-06-05 ENCOUNTER — Emergency Department (HOSPITAL_COMMUNITY): Payer: 59

## 2020-06-05 ENCOUNTER — Emergency Department (HOSPITAL_COMMUNITY)
Admission: EM | Admit: 2020-06-05 | Discharge: 2020-06-05 | Disposition: A | Discharge status: Home / Self Care | Payer: 59 | Attending: Emergency Medicine | Admitting: Emergency Medicine

## 2020-06-05 ENCOUNTER — Encounter (HOSPITAL_COMMUNITY): Payer: Self-pay | Admitting: Emergency Medicine

## 2020-06-05 DIAGNOSIS — R1013 Epigastric pain: Secondary | ICD-10-CM | POA: Diagnosis not present

## 2020-06-05 DIAGNOSIS — R0789 Other chest pain: Secondary | ICD-10-CM | POA: Insufficient documentation

## 2020-06-05 LAB — CBC
HCT: 39.6 % (ref 36.0–46.0)
Hemoglobin: 12.4 g/dL (ref 12.0–15.0)
MCH: 28.9 pg (ref 26.0–34.0)
MCHC: 31.3 g/dL (ref 30.0–36.0)
MCV: 92.3 fL (ref 80.0–100.0)
Platelets: 282 10*3/uL (ref 150–400)
RBC: 4.29 MIL/uL (ref 3.87–5.11)
RDW: 12.9 % (ref 11.5–15.5)
WBC: 6.8 10*3/uL (ref 4.0–10.5)
nRBC: 0 % (ref 0.0–0.2)

## 2020-06-05 LAB — COMPREHENSIVE METABOLIC PANEL
ALT: 9 U/L (ref 0–44)
AST: 16 U/L (ref 15–41)
Albumin: 4.5 g/dL (ref 3.5–5.0)
Alkaline Phosphatase: 52 U/L (ref 38–126)
Anion gap: 12 (ref 5–15)
BUN: 9 mg/dL (ref 6–20)
CO2: 23 mmol/L (ref 22–32)
Calcium: 9.6 mg/dL (ref 8.9–10.3)
Chloride: 102 mmol/L (ref 98–111)
Creatinine, Ser: 0.76 mg/dL (ref 0.44–1.00)
GFR, Estimated: >60 mL/min (ref >60)
Glucose, Bld: 80 mg/dL (ref 70–99)
Potassium: 3.7 mmol/L (ref 3.5–5.1)
Sodium: 137 mmol/L (ref 135–145)
Total Bilirubin: 0.5 mg/dL (ref 0.3–1.2)
Total Protein: 8 g/dL (ref 6.5–8.1)

## 2020-06-05 LAB — I-STAT BETA HCG BLOOD, ED (MC, WL, AP ONLY): I-stat hCG, quantitative: <5 m[IU]/mL (ref <5)

## 2020-06-05 LAB — LIPASE, BLOOD: Lipase: 37 U/L (ref 11–51)

## 2020-06-05 LAB — TROPONIN I (HIGH SENSITIVITY): Troponin I (High Sensitivity): <2 ng/L (ref <18)
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
## 2020-06-05 NOTE — Discharge Instructions (Addendum)
Please take Tylenol (acetaminophen) to relieve your pain.  You may take tylenol, up to 1,000 mg (two extra strength pills).  Do not take more than 3,000 mg tylenol in a 24 hour period.  Please check all medication labels as many medications such as pain and cold medications may contain tylenol. Please do not drink alcohol while taking this medication.   Please take your heart burn medications.

## 2020-06-05 NOTE — ED Triage Notes (Signed)
Pt endorses central CP every time she hits a bump. 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 Hx of GERD

## 2021-07-14 ENCOUNTER — Emergency Department (HOSPITAL_COMMUNITY)
Admission: EM | Admit: 2021-07-14 | Discharge: 2021-07-14 | Disposition: A | Discharge status: Home / Self Care | Payer: 59 | Attending: Emergency Medicine | Admitting: Emergency Medicine

## 2021-07-14 ENCOUNTER — Encounter (HOSPITAL_COMMUNITY): Payer: Self-pay

## 2021-07-14 DIAGNOSIS — Z79899 Other long term (current) drug therapy: Secondary | ICD-10-CM | POA: Insufficient documentation

## 2021-07-14 DIAGNOSIS — U071 COVID-19: Secondary | ICD-10-CM | POA: Diagnosis not present

## 2021-07-14 DIAGNOSIS — R112 Nausea with vomiting, unspecified: Secondary | ICD-10-CM | POA: Diagnosis present

## 2021-07-14 LAB — CBC WITH DIFFERENTIAL/PLATELET
Abs Immature Granulocytes: 0.01 10*3/uL (ref 0.00–0.07)
Basophils Absolute: 0 10*3/uL (ref 0.0–0.1)
Basophils Relative: 0 %
Eosinophils Absolute: 0 10*3/uL (ref 0.0–0.5)
Eosinophils Relative: 0 %
HCT: 37.6 % (ref 36.0–46.0)
Hemoglobin: 12.1 g/dL (ref 12.0–15.0)
Immature Granulocytes: 0 %
Lymphocytes Relative: 6 %
Lymphs Abs: 0.4 10*3/uL — ABNORMAL LOW (ref 0.7–4.0)
MCH: 28.3 pg (ref 26.0–34.0)
MCHC: 32.2 g/dL (ref 30.0–36.0)
MCV: 87.9 fL (ref 80.0–100.0)
Monocytes Absolute: 0.5 10*3/uL (ref 0.1–1.0)
Monocytes Relative: 9 %
Neutro Abs: 5.3 10*3/uL (ref 1.7–7.7)
Neutrophils Relative %: 85 %
Platelets: 258 10*3/uL (ref 150–400)
RBC: 4.28 MIL/uL (ref 3.87–5.11)
RDW: 14 % (ref 11.5–15.5)
WBC: 6.3 10*3/uL (ref 4.0–10.5)
nRBC: 0 % (ref 0.0–0.2)

## 2021-07-14 LAB — RESP PANEL BY RT-PCR (FLU A&B, COVID) ARPGX2
Influenza A by PCR: NEGATIVE
Influenza B by PCR: NEGATIVE
SARS Coronavirus 2 by RT PCR: POSITIVE — AB

## 2021-07-14 LAB — COMPREHENSIVE METABOLIC PANEL
ALT: 11 U/L (ref 0–44)
AST: 17 U/L (ref 15–41)
Albumin: 4.7 g/dL (ref 3.5–5.0)
Alkaline Phosphatase: 44 U/L (ref 38–126)
Anion gap: 9 (ref 5–15)
BUN: 13 mg/dL (ref 6–20)
CO2: 24 mmol/L (ref 22–32)
Calcium: 9.5 mg/dL (ref 8.9–10.3)
Chloride: 101 mmol/L (ref 98–111)
Creatinine, Ser: 0.77 mg/dL (ref 0.44–1.00)
GFR, Estimated: >60 mL/min (ref >60)
Glucose, Bld: 95 mg/dL (ref 70–99)
Potassium: 3.5 mmol/L (ref 3.5–5.1)
Sodium: 134 mmol/L — ABNORMAL LOW (ref 135–145)
Total Bilirubin: 0.6 mg/dL (ref 0.3–1.2)
Total Protein: 8.7 g/dL — ABNORMAL HIGH (ref 6.5–8.1)

## 2021-07-14 LAB — HCG, QUANTITATIVE, PREGNANCY: hCG, Beta Chain, Quant, S: <1 m[IU]/mL (ref <5)

## 2021-07-14 MED ORDER — ONDANSETRON HCL 4 MG PO TABS: 4.0000 mg | ORAL_TABLET | Freq: Four times a day (QID) | ORAL | 0 refills | Status: DC

## 2021-07-14 MED ORDER — NIRMATRELVIR/RITONAVIR (PAXLOVID)TABLET: 3.0000 | ORAL_TABLET | Freq: Two times a day (BID) | ORAL | 0 refills | Status: AC

## 2021-07-14 MED ORDER — LACTATED RINGERS IV BOLUS
1000.0000 mL | Freq: Once | INTRAVENOUS | Status: AC
  Administered 2021-07-14: 09:00:00 1000 mL via INTRAVENOUS

## 2021-07-14 MED ORDER — ONDANSETRON HCL 4 MG/2ML IJ SOLN
4.0000 mg | Freq: Once | INTRAMUSCULAR | Status: AC
  Administered 2021-07-14: 09:00:00 4 mg via INTRAVENOUS
  Filled 2021-07-14: qty 2

## 2021-07-14 NOTE — Discharge Instructions (Signed)
You tested positive for COVID today.  If you take the covid medication you need to ensure you do not get pregnant in the next few months.  You can take tylenol as needed for aches and pain and rest and drink plenty of fluids.

## 2021-07-14 NOTE — ED Provider Notes (Signed)
[facility]-EMERGENCY DEPT Provider Note   CSN: [id number] Arrival date & time: 07/14/21  4098     History Chief Complaint  Patient presents with   Vomiting    Anna Wolfe is a 35 y.o. female.  Patient is a 35 year old female with no significant past medical history who is presenting today with complaint of nausea vomiting, cough and generalized aches and pains.  She reports that the cough worsened overnight and she had significant vomiting overnight and even saw a little blood in her emesis.  She is not having any specific abdominal pain except some mild epigastric discomfort that radiates up into her chest associated with the vomiting.  Nonproductive cough.  No shortness of breath.  She does not have any known fever.  No one else in her home is ill.  No known bad food exposure.  She takes no medications regularly.  LMP was last week.  The history is provided by the patient.      Past Medical History:  Diagnosis Date   No pertinent past medical history     Patient Active Problem List   Diagnosis Date Noted   Laceration of right little finger without foreign body without damage to nail 05/06/2020   Ectopic pregnancy 06/06/2011    Past Surgical History:  Procedure Laterality Date   HAND SURGERY     NO PAST SURGERIES       OB History     Gravida  1   Para      Term      Preterm      AB      Living         SAB      IAB      Ectopic      Multiple      Live Births              Family History  Problem Relation Age of Onset   Healthy Mother    Anesthesia problems Neg Hx     Social History   Tobacco Use   Smoking status: Never   Smokeless tobacco: Never  Substance Use Topics   Alcohol use: No    Comment: once a year   Drug use: No    Home Medications Prior to Admission medications   Medication Sig Start Date End Date Taking? Authorizing Provider  nirmatrelvir/ritonavir EUA (PAXLOVID) 20 x 150 MG & 10 x 100MG  TABS  Take 3 tablets by mouth 2 (two) times daily for 5 days. Patient GFR is 65. Take nirmatrelvir (150 mg) two tablets twice daily for 5 days and ritonavir (100 mg) one tablet twice daily for 5 days. 07/14/21 07/19/21 Yes Lizzett Nobile, 09/16/21, MD  ondansetron (ZOFRAN) 4 MG tablet Take 1 tablet (4 mg total) by mouth every 6 (six) hours. 07/14/21  Yes 07/16/21, MD    Allergies    Patient has no known allergies.  Review of Systems   Review of Systems  All other systems reviewed and are negative.  Physical Exam Updated Vital Signs BP 106/73    Pulse 91    Temp 98.9 F (37.2 C) (Oral)    Resp 20    LMP 06/27/2021 (Approximate)    SpO2 100%   Physical Exam Vitals and nursing note reviewed.  Constitutional:      General: She is not in acute distress.    Appearance: Normal appearance. She is well-developed.  HENT:     Head: Normocephalic and atraumatic.  Eyes:  Pupils: Pupils are equal, round, and reactive to light.  Cardiovascular:     Rate and Rhythm: Normal rate and regular rhythm.     Heart sounds: Normal heart sounds. No murmur heard.   No friction rub.  Pulmonary:     Effort: Pulmonary effort is normal.     Breath sounds: Normal breath sounds. No wheezing or rales.  Abdominal:     General: Bowel sounds are normal. There is no distension.     Palpations: Abdomen is soft.     Tenderness: There is abdominal tenderness in the epigastric area. There is no guarding or rebound.  Musculoskeletal:        General: No tenderness. Normal range of motion.     Comments: No edema  Skin:    General: Skin is warm and dry.     Findings: No rash.  Neurological:     Mental Status: She is alert and oriented to person, place, and time. Mental status is at baseline.     Cranial Nerves: No cranial nerve deficit.  Psychiatric:        Mood and Affect: Mood normal.        Behavior: Behavior normal.    ED Results / Procedures / Treatments   Labs (all labs ordered are listed, but only abnormal  results are displayed) Labs Reviewed  RESP PANEL BY RT-PCR (FLU A&B, COVID) ARPGX2 - Abnormal; Notable for the following components:      Result Value   SARS Coronavirus 2 by RT PCR POSITIVE (*)    All other components within normal limits  CBC WITH DIFFERENTIAL/PLATELET - Abnormal; Notable for the following components:   Lymphs Abs 0.4 (*)    All other components within normal limits  COMPREHENSIVE METABOLIC PANEL - Abnormal; Notable for the following components:   Sodium 134 (*)    Total Protein 8.7 (*)    All other components within normal limits  HCG, QUANTITATIVE, PREGNANCY    EKG None  Radiology No results found.  Procedures Procedures   Medications Ordered in ED Medications  lactated ringers bolus 1,000 mL (0 mLs Intravenous Stopped 07/14/21 1122)  ondansetron (ZOFRAN) injection 4 mg (4 mg Intravenous Given 07/14/21 3419)    ED Course  I have reviewed the triage vital signs and the nursing notes.  Pertinent labs & imaging results that were available during my care of the patient were reviewed by me and considered in my medical decision making (see chart for details).    MDM Rules/Calculators/A&P                         Patient presenting today with nausea and vomiting, epigastric discomfort and cough that was present throughout the night.  She has some epigastric discomfort still but in general is feeling better.  Low suspicion for acute pelvic pathology.  She denies any recent antibiotic use or travel.  Low suspicion for foodborne illness.  She is mildly tachycardic here we will give IV fluids and Zofran.  Will p.o. challenge.  Also possibility for COVID or flu.   11:26 AM I ordered and interpreted the labs which are wnl except for being COVID positive.  Pt desires paxlovid and was given a ppx.      Final Clinical Impression(s) / ED Diagnoses Final diagnoses:  COVID    Rx / DC Orders ED Discharge Orders          Ordered    nirmatrelvir/ritonavir EUA  (PAXLOVID) 20  x 150 MG & 10 x 100MG  TABS  2 times daily        07/14/21 1109    ondansetron (ZOFRAN) 4 MG tablet  Every 6 hours        07/14/21 1110             07/16/21, MD 07/14/21 1127

## 2021-07-14 NOTE — ED Triage Notes (Signed)
Pt presents with c/o congestion and vomiting since last night. Pt reports she started noticing some blood in her vomit last night.

## 2023-01-24 ENCOUNTER — Encounter (HOSPITAL_COMMUNITY): Payer: Self-pay

## 2023-01-24 ENCOUNTER — Ambulatory Visit (HOSPITAL_COMMUNITY)
Admission: EM | Admit: 2023-01-24 | Discharge: 2023-01-24 | Disposition: A | Discharge status: Home / Self Care | Payer: 59 | Attending: Family Medicine | Admitting: Family Medicine

## 2023-01-24 DIAGNOSIS — R070 Pain in throat: Secondary | ICD-10-CM | POA: Diagnosis not present

## 2023-01-24 DIAGNOSIS — R490 Dysphonia: Secondary | ICD-10-CM

## 2023-01-24 DIAGNOSIS — R0982 Postnasal drip: Secondary | ICD-10-CM

## 2023-01-24 LAB — POCT RAPID STREP A (OFFICE): Rapid Strep A Screen: NEGATIVE

## 2023-01-24 MED ORDER — FLUTICASONE PROPIONATE 50 MCG/ACT NA SUSP: 1.0000 | Freq: Every day | NASAL | 0 refills | Status: DC

## 2023-01-24 NOTE — ED Triage Notes (Signed)
Here for sore throat and cough x 2-3 weeks. 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

## 2023-01-24 NOTE — ED Provider Notes (Signed)
[facility]    CSN: [id number] Arrival date & time: 01/24/23  1107      History   Chief Complaint Chief Complaint  Patient presents with   Sore Throat   Cough    HPI Anna Wolfe is a 37 y.o. female.   Patient presents today for 2-day 3-day history of sore throat, hoarseness, headache, and fatigue.  She denies fever, body aches or chills, cough, shortness of breath or chest pain, runny or stuffy nose, sneezing, eye itchiness or watering, ear pain, abdominal pain, nausea/vomiting, diarrhea, lack of appetite, and new rash.  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  After review of chart, it looks like patient has seen otolaryngology for this in the past, is currently undergoing speech therapy for same.    Past Medical History:  Diagnosis Date   No pertinent past medical history     Patient Active Problem List   Diagnosis Date Noted   Laceration of right little finger without foreign body without damage to nail 05/06/2020   Ectopic pregnancy 06/06/2011    Past Surgical History:  Procedure Laterality Date   HAND SURGERY     NO PAST SURGERIES      OB History     Gravida  1   Para      Term      Preterm      AB      Living         SAB      IAB      Ectopic      Multiple      Live Births               Home Medications    Prior to Admission medications   Medication Sig Start Date End Date Taking? Authorizing Provider  fluticasone (FLONASE) 50 MCG/ACT nasal spray Place 1 spray into both nostrils daily. 01/24/23  Yes Cathlean Marseilles A, NP  ondansetron (ZOFRAN) 4 MG tablet Take 1 tablet (4 mg total) by mouth every 6 (six) hours. 07/14/21   Gwyneth Sprout, MD    Family History Family History  Problem Relation Age of Onset   Healthy Mother    Anesthesia problems Neg Hx     Social History Social History   Tobacco Use   Smoking status: Never    Smokeless tobacco: Never  Substance Use Topics   Alcohol use: No    Comment: once a year   Drug use: No     Allergies   Patient has no known allergies.   Review of Systems Review of Systems Per HPI  Physical Exam Triage Vital Signs ED Triage Vitals  Enc Vitals Group     BP 01/24/23 1237 (!) 141/83     Pulse Rate 01/24/23 1241 87     Resp 01/24/23 1237 16     Temp 01/24/23 1237 98.3 F (36.8 C)     Temp Source 01/24/23 1237 Oral     SpO2 01/24/23 1237 99 %     Weight --      Height --      Head Circumference --      Peak Flow --      Pain Score --      Pain Loc --      Pain Edu? --      Excl. in GC? --    No data found.  Updated Vital  Signs BP (!) 141/83 (BP Location: Left Arm)   Pulse 87   Temp 97.9 F (36.6 C) (Oral)   Resp 16   LMP 01/13/2023 (Exact Date)   SpO2 99%   Visual Acuity Right Eye Distance:   Left Eye Distance:   Bilateral Distance:    Right Eye Near:   Left Eye Near:    Bilateral Near:     Physical Exam Vitals and nursing note reviewed.  Constitutional:      General: She is not in acute distress.    Appearance: She is well-developed. She is not toxic-appearing.  HENT:     Head: Normocephalic and atraumatic.     Right Ear: Tympanic membrane and ear canal normal. No drainage, swelling or tenderness. No middle ear effusion. Tympanic membrane is not erythematous.     Left Ear: Tympanic membrane and ear canal normal. No drainage, swelling or tenderness.  No middle ear effusion. Tympanic membrane is not erythematous.     Nose: No congestion or rhinorrhea.     Mouth/Throat:     Mouth: Mucous membranes are moist. No oral lesions.     Pharynx: No pharyngeal swelling, oropharyngeal exudate or uvula swelling.     Comments: Post nasal drainage Eyes:     Conjunctiva/sclera: Conjunctivae normal.     Pupils: Pupils are equal, round, and reactive to light.  Cardiovascular:     Rate and Rhythm: Normal rate and regular rhythm.  Pulmonary:      Effort: Pulmonary effort is normal. No respiratory distress.     Breath sounds: Normal breath sounds. No wheezing, rhonchi or rales.  Musculoskeletal:     Cervical back: Normal range of motion.  Lymphadenopathy:     Cervical: No cervical adenopathy.  Skin:    General: Skin is warm and dry.     Capillary Refill: Capillary refill takes less than 2 seconds.     Coloration: Skin is not pale.     Findings: No erythema or rash.  Neurological:     Mental Status: She is alert and oriented to person, place, and time.      UC Treatments / Results  Labs (all labs ordered are listed, but only abnormal results are displayed) Labs Reviewed  POCT RAPID STREP A (OFFICE)    EKG   Radiology No results found.  Procedures Procedures (including critical care time)  Medications Ordered in UC Medications - No data to display  Initial Impression / Assessment and Plan / UC Course  I have reviewed the triage vital signs and the nursing notes.  Pertinent labs & imaging results that were available during my care of the patient were reviewed by me and considered in my medical decision making (see chart for details).   Patient is well-appearing, normotensive, afebrile, not tachycardic, not tachypneic, oxygenating well on room air.    1. Post-nasal drainage 2. Hoarseness 3. Throat pain Unclear etiology Suspect postnasal drainage may be contributing Start Flonase Recommended follow-up with ENT as planned for persistent/worsening symptoms despite treatment  The patient was given the opportunity to ask questions.  All questions answered to their satisfaction.  The patient is in agreement to this plan.   Final Clinical Impressions(s) / UC Diagnoses   Final diagnoses:  Post-nasal drainage  Hoarseness  Throat pain     Discharge Instructions      Start using the Flonase nasal spray to help with post nasal drainage  Follow up with PCP/ENT with persistent/worsening symptoms despite  treatment  ED Prescriptions     Medication Sig Dispense Auth. Provider   fluticasone (FLONASE) 50 MCG/ACT nasal spray Place 1 spray into both nostrils daily. 16 g Valentino Nose, NP      PDMP not reviewed this encounter.   Valentino Nose, NP 01/24/23 [phone]

## 2023-01-24 NOTE — Discharge Instructions (Signed)
Start using the Flonase nasal spray to help with post nasal drainage  Follow up with PCP/ENT with persistent/worsening symptoms despite treatment

## 2023-01-31 ENCOUNTER — Other Ambulatory Visit: Payer: Self-pay

## 2023-01-31 ENCOUNTER — Emergency Department (HOSPITAL_COMMUNITY)
Admission: EM | Admit: 2023-01-31 | Discharge: 2023-01-31 | Disposition: A | Discharge status: Home / Self Care | Payer: 59 | Attending: Emergency Medicine | Admitting: Emergency Medicine

## 2023-01-31 ENCOUNTER — Encounter (HOSPITAL_COMMUNITY): Payer: Self-pay | Admitting: Emergency Medicine

## 2023-01-31 DIAGNOSIS — R110 Nausea: Secondary | ICD-10-CM

## 2023-01-31 DIAGNOSIS — Z1152 Encounter for screening for COVID-19: Secondary | ICD-10-CM | POA: Insufficient documentation

## 2023-01-31 LAB — URINALYSIS, ROUTINE W REFLEX MICROSCOPIC
Bilirubin Urine: NEGATIVE
Glucose, UA: NEGATIVE mg/dL
Hgb urine dipstick: NEGATIVE
Ketones, ur: NEGATIVE mg/dL
Nitrite: NEGATIVE
Protein, ur: NEGATIVE mg/dL
Specific Gravity, Urine: 1.013 (ref 1.005–1.030)
pH: 7 (ref 5.0–8.0)

## 2023-01-31 LAB — CBC
HCT: 38.4 % (ref 36.0–46.0)
Hemoglobin: 12.4 g/dL (ref 12.0–15.0)
MCH: 29.3 pg (ref 26.0–34.0)
MCHC: 32.3 g/dL (ref 30.0–36.0)
MCV: 90.8 fL (ref 80.0–100.0)
Platelets: 260 10*3/uL (ref 150–400)
RBC: 4.23 MIL/uL (ref 3.87–5.11)
RDW: 13.7 % (ref 11.5–15.5)
WBC: 5.5 10*3/uL (ref 4.0–10.5)
nRBC: 0 % (ref 0.0–0.2)

## 2023-01-31 LAB — COMPREHENSIVE METABOLIC PANEL
ALT: 15 U/L (ref 0–44)
AST: 17 U/L (ref 15–41)
Albumin: 3.6 g/dL (ref 3.5–5.0)
Alkaline Phosphatase: 44 U/L (ref 38–126)
Anion gap: 8 (ref 5–15)
BUN: 6 mg/dL (ref 6–20)
CO2: 23 mmol/L (ref 22–32)
Calcium: 9.2 mg/dL (ref 8.9–10.3)
Chloride: 103 mmol/L (ref 98–111)
Creatinine, Ser: 0.83 mg/dL (ref 0.44–1.00)
GFR, Estimated: >60 mL/min (ref >60)
Glucose, Bld: 96 mg/dL (ref 70–99)
Potassium: 4 mmol/L (ref 3.5–5.1)
Sodium: 134 mmol/L — ABNORMAL LOW (ref 135–145)
Total Bilirubin: 0.5 mg/dL (ref 0.3–1.2)
Total Protein: 7.4 g/dL (ref 6.5–8.1)

## 2023-01-31 LAB — HCG, SERUM, QUALITATIVE: Preg, Serum: NEGATIVE

## 2023-01-31 LAB — LIPASE, BLOOD: Lipase: 38 U/L (ref 11–51)

## 2023-01-31 LAB — SARS CORONAVIRUS 2 BY RT PCR: SARS Coronavirus 2 by RT PCR: NEGATIVE

## 2023-01-31 MED ORDER — ONDANSETRON 4 MG PO TBDP
4.0000 mg | ORAL_TABLET | Freq: Once | ORAL | Status: AC
  Administered 2023-01-31: 4 mg via ORAL
  Filled 2023-01-31: qty 1

## 2023-01-31 MED ORDER — ONDANSETRON HCL 4 MG/2ML IJ SOLN: 4.0000 mg | Freq: Once | INTRAMUSCULAR | Status: DC

## 2023-01-31 MED ORDER — SODIUM CHLORIDE 0.9 % IV BOLUS: 1000.0000 mL | Freq: Once | INTRAVENOUS | Status: DC

## 2023-01-31 MED ORDER — ONDANSETRON 4 MG PO TBDP: 4.0000 mg | ORAL_TABLET | Freq: Three times a day (TID) | ORAL | 0 refills | Status: DC | PRN

## 2023-01-31 NOTE — ED Triage Notes (Signed)
Pt c/o abdominal pain since last night, states that she thinks that it is from the food she ate x 2 days ago.

## 2023-01-31 NOTE — Discharge Instructions (Signed)
You were evaluated in the Emergency Department and after careful evaluation, we did not find any emergent condition requiring admission or further testing in the hospital.  Your workup today overall reassuring.  Take Zofran as needed for nausea.   Please return to the Emergency Department if you experience any worsening of your condition.  We encourage you to follow up with a primary care provider.  Thank you for allowing [place] to be a part of your care.

## 2023-01-31 NOTE — ED Provider Notes (Signed)
[place] EMERGENCY DEPARTMENT AT [facility] Provider Note   CSN: [id number] Arrival date & time: 01/31/23  0636     History  Chief Complaint  Patient presents with   Abdominal Pain    Anna Wolfe is a 37 y.o. female.  HPI 37 year old female with a history of ectopic pregnancy presents to the ER with complaints of weakness and nausea.  Symptoms began yesterday.  She reports some dry heaving but no actual vomiting.  Last bowel movement was yesterday normal, no diarrhea.  She felt feverish on her drive to the emergency department but has not measured a temperature.  She does not know if it is the food that she ate last night though she cannot think of anything that would make her sick.  She denies any recent sick contacts.  Denies any cough.  No recent travel.  Denies any dysuria, hematuria.  She also endorses a headache that is mostly frontal in nature.  No thunderclap quality.  Home Medications Prior to Admission medications   Medication Sig Start Date End Date Taking? Authorizing Provider  ondansetron (ZOFRAN-ODT) 4 MG disintegrating tablet Take 1 tablet (4 mg total) by mouth every 8 (eight) hours as needed for nausea or vomiting. 01/31/23  Yes Trudee Grip A, PA-C  fluticasone (FLONASE) 50 MCG/ACT nasal spray Place 1 spray into both nostrils daily. 01/24/23   Valentino Nose, NP  ondansetron (ZOFRAN) 4 MG tablet Take 1 tablet (4 mg total) by mouth every 6 (six) hours. 07/14/21   Gwyneth Sprout, MD      Allergies    Patient has no known allergies.    Review of Systems   Review of Systems Ten systems reviewed and are negative for acute change, except as noted in the HPI.   Physical Exam Updated Vital Signs BP 118/84   Pulse (!) 103   Temp 98.5 F (36.9 C)   Resp 20   Ht 5\' 4"  (1.626 m)   Wt 45.4 kg   LMP 01/13/2023 (Exact Date)   SpO2 99%   BMI 17.18 kg/m  Physical Exam Vitals and nursing note reviewed.  Constitutional:      General: She is not in  acute distress.    Appearance: She is well-developed.  HENT:     Head: Normocephalic and atraumatic.  Eyes:     Conjunctiva/sclera: Conjunctivae normal.  Cardiovascular:     Rate and Rhythm: Normal rate and regular rhythm.     Heart sounds: No murmur heard. Pulmonary:     Effort: Pulmonary effort is normal. No respiratory distress.     Breath sounds: Normal breath sounds.  Abdominal:     Palpations: Abdomen is soft.     Tenderness: There is no abdominal tenderness. There is no right CVA tenderness or left CVA tenderness.     Comments: Abdomen soft, no significant tenderness  Musculoskeletal:        General: No swelling.     Cervical back: Neck supple.  Skin:    General: Skin is warm and dry.     Capillary Refill: Capillary refill takes less than 2 seconds.  Neurological:     Mental Status: She is alert.  Psychiatric:        Mood and Affect: Mood normal.     ED Results / Procedures / Treatments   Labs (all labs ordered are listed, but only abnormal results are displayed) Labs Reviewed  COMPREHENSIVE METABOLIC PANEL - Abnormal; Notable for the following components:  Result Value   Sodium 134 (*)    All other components within normal limits  URINALYSIS, ROUTINE W REFLEX MICROSCOPIC - Abnormal; Notable for the following components:   APPearance HAZY (*)    Leukocytes,Ua SMALL (*)    Bacteria, UA RARE (*)    All other components within normal limits  SARS CORONAVIRUS 2 BY RT PCR  LIPASE, BLOOD  CBC  HCG, SERUM, QUALITATIVE    EKG EKG Interpretation Date/Time:  Monday January 31 2023 06:47:56 EDT Ventricular Rate:  95 PR Interval:  134 QRS Duration:  76 QT Interval:  348 QTC Calculation: 438 R Axis:   77  Text Interpretation: Sinus rhythm No significant change since last tracing Confirmed by Zadie Rhine ([id number]) on 01/31/2023 6:54:06 AM  Radiology No results found.  Procedures Procedures    Medications Ordered in ED Medications  ondansetron  (ZOFRAN-ODT) disintegrating tablet 4 mg (4 mg Oral Given 01/31/23 0757)    ED Course/ Medical Decision Making/ A&P                             Medical Decision Making Amount and/or Complexity of Data Reviewed Labs: ordered.  Risk Prescription drug management.  37 year old female presented to the ER with complaints of weakness, nausea and mild abdominal pain.  Vitals overall reassuring on arrival, afebrile, normotensive.  Physical exam with no significant abdominal tenderness, no significant CVA tenderness.  Differential includes viral infection, UTI, pneumonia, sepsis, anemia, pregnancy, this is not all inclusive  Labs ordered, reviewed and interpreted by me CBC without leukocytosis, normal hemoglobin.  CMP with mild hyponatremia 134, no other significant electrolyte abnormalities, normal creatinine and LFTs.  Lipase normal. COVID and negative.  UA not consistent with UTI.  Pregnancy is negative.  Overall, given benign abdominal exam, I have low suspicion for acute abdomen.  No evidence of anemia on labs.  Electrolytes normal.  Low suspicion for pelvic pathology occluding STI, TOA, ectopic pregnancy..  Workup overall reassuring.  Patient does feel improved after Zofran.  Will send her home with a prescription for this.  We discussed return precautions.  She voiced understanding and is agreeable.  Stable for discharge.  Final Clinical Impression(s) / ED Diagnoses Final diagnoses:  Nausea    Rx / DC Orders ED Discharge Orders          Ordered    ondansetron (ZOFRAN-ODT) 4 MG disintegrating tablet  Every 8 hours PRN        01/31/23 0923              Mare Ferrari, PA-C 01/31/23 6045    Loetta Rough, MD 01/31/23 1013

## 2023-02-23 ENCOUNTER — Institutional Professional Consult (permissible substitution): Payer: 59 | Admitting: Pulmonary Disease

## 2023-03-01 ENCOUNTER — Encounter (HOSPITAL_COMMUNITY): Payer: Self-pay

## 2023-03-01 ENCOUNTER — Other Ambulatory Visit: Payer: Self-pay

## 2023-03-01 ENCOUNTER — Emergency Department (HOSPITAL_COMMUNITY)
Admission: EM | Admit: 2023-03-01 | Discharge: 2023-03-02 | Disposition: A | Discharge status: Home / Self Care | Payer: 59 | Attending: Emergency Medicine | Admitting: Emergency Medicine

## 2023-03-01 DIAGNOSIS — J189 Pneumonia, unspecified organism: Secondary | ICD-10-CM

## 2023-03-01 DIAGNOSIS — R197 Diarrhea, unspecified: Secondary | ICD-10-CM | POA: Diagnosis not present

## 2023-03-01 DIAGNOSIS — R1084 Generalized abdominal pain: Secondary | ICD-10-CM | POA: Insufficient documentation

## 2023-03-01 DIAGNOSIS — D259 Leiomyoma of uterus, unspecified: Secondary | ICD-10-CM | POA: Diagnosis not present

## 2023-03-01 DIAGNOSIS — R109 Unspecified abdominal pain: Secondary | ICD-10-CM | POA: Diagnosis not present

## 2023-03-01 DIAGNOSIS — J168 Pneumonia due to other specified infectious organisms: Secondary | ICD-10-CM | POA: Diagnosis not present

## 2023-03-01 DIAGNOSIS — R112 Nausea with vomiting, unspecified: Secondary | ICD-10-CM | POA: Insufficient documentation

## 2023-03-01 DIAGNOSIS — N3289 Other specified disorders of bladder: Secondary | ICD-10-CM | POA: Diagnosis not present

## 2023-03-01 LAB — CBC WITH DIFFERENTIAL/PLATELET
Abs Immature Granulocytes: 0.08 10*3/uL — ABNORMAL HIGH (ref 0.00–0.07)
Basophils Absolute: 0.1 10*3/uL (ref 0.0–0.1)
Basophils Relative: 0 %
Eosinophils Absolute: 0 10*3/uL (ref 0.0–0.5)
Eosinophils Relative: 0 %
HCT: 40.5 % (ref 36.0–46.0)
Hemoglobin: 13 g/dL (ref 12.0–15.0)
Immature Granulocytes: 0 %
Lymphocytes Relative: 6 %
Lymphs Abs: 1.2 10*3/uL (ref 0.7–4.0)
MCH: 29.4 pg (ref 26.0–34.0)
MCHC: 32.1 g/dL (ref 30.0–36.0)
MCV: 91.6 fL (ref 80.0–100.0)
Monocytes Absolute: 0.8 10*3/uL (ref 0.1–1.0)
Monocytes Relative: 4 %
Neutro Abs: 17.5 10*3/uL — ABNORMAL HIGH (ref 1.7–7.7)
Neutrophils Relative %: 90 %
Platelets: 289 10*3/uL (ref 150–400)
RBC: 4.42 MIL/uL (ref 3.87–5.11)
RDW: 13.4 % (ref 11.5–15.5)
WBC: 19.7 10*3/uL — ABNORMAL HIGH (ref 4.0–10.5)
nRBC: 0 % (ref 0.0–0.2)

## 2023-03-01 LAB — URINALYSIS, ROUTINE W REFLEX MICROSCOPIC
Bilirubin Urine: NEGATIVE
Glucose, UA: NEGATIVE mg/dL
Ketones, ur: NEGATIVE mg/dL
Leukocytes,Ua: NEGATIVE
Nitrite: NEGATIVE
Protein, ur: 30 mg/dL — AB
RBC / HPF: >50 RBC/hpf (ref 0–5)
Specific Gravity, Urine: 1.021 (ref 1.005–1.030)
pH: 5 (ref 5.0–8.0)

## 2023-03-01 LAB — HCG, SERUM, QUALITATIVE: Preg, Serum: NEGATIVE

## 2023-03-01 MED ORDER — SODIUM CHLORIDE 0.9 % IV BOLUS
1000.0000 mL | Freq: Once | INTRAVENOUS | Status: AC
  Administered 2023-03-02: 1000 mL via INTRAVENOUS

## 2023-03-01 MED ORDER — ONDANSETRON 4 MG PO TBDP
4.0000 mg | ORAL_TABLET | Freq: Once | ORAL | Status: AC | PRN
  Administered 2023-03-01: 4 mg via ORAL
  Filled 2023-03-01: qty 1

## 2023-03-01 MED ORDER — MORPHINE SULFATE (PF) 4 MG/ML IV SOLN
4.0000 mg | Freq: Once | INTRAVENOUS | Status: AC
  Administered 2023-03-02: 4 mg via INTRAVENOUS
  Filled 2023-03-01: qty 1

## 2023-03-01 NOTE — ED Triage Notes (Signed)
Arrives [facility] from home with generalized abdominal pain, nausea, vomiting and diarrhea beginning 2 hours ago.

## 2023-03-02 ENCOUNTER — Encounter (HOSPITAL_COMMUNITY): Payer: Self-pay

## 2023-03-02 ENCOUNTER — Emergency Department (HOSPITAL_COMMUNITY): Payer: 59

## 2023-03-02 DIAGNOSIS — R109 Unspecified abdominal pain: Secondary | ICD-10-CM | POA: Diagnosis not present

## 2023-03-02 DIAGNOSIS — N3289 Other specified disorders of bladder: Secondary | ICD-10-CM | POA: Diagnosis not present

## 2023-03-02 DIAGNOSIS — D259 Leiomyoma of uterus, unspecified: Secondary | ICD-10-CM | POA: Diagnosis not present

## 2023-03-02 MED ORDER — ONDANSETRON 4 MG PO TBDP: 4.0000 mg | ORAL_TABLET | Freq: Three times a day (TID) | ORAL | 0 refills | Status: DC | PRN

## 2023-03-02 MED ORDER — AMOXICILLIN 500 MG PO CAPS: 1000.0000 mg | ORAL_CAPSULE | Freq: Three times a day (TID) | ORAL | 0 refills | Status: AC

## 2023-03-02 MED ORDER — SODIUM CHLORIDE 0.9 % IV SOLN
1.0000 g | Freq: Once | INTRAVENOUS | Status: AC
  Administered 2023-03-02: 1 g via INTRAVENOUS
  Filled 2023-03-02: qty 10

## 2023-03-02 MED ORDER — IOHEXOL 300 MG/ML  SOLN
80.0000 mL | Freq: Once | INTRAMUSCULAR | Status: AC | PRN
  Administered 2023-03-02: 80 mL via INTRAVENOUS

## 2023-03-02 MED ORDER — SODIUM CHLORIDE (PF) 0.9 % IJ SOLN
INTRAMUSCULAR | Status: AC
  Filled 2023-03-02: qty 50

## 2023-03-02 NOTE — ED Provider Notes (Signed)
[place] EMERGENCY DEPARTMENT AT [facility] Provider Note   CSN: [id number] Arrival date & time: 03/01/23  2215     History  Chief Complaint  Patient presents with   Abdominal Pain    Anna Wolfe is a 37 y.o. female.  37 year old otherwise healthy female brought in by EMS from home with concern for sudden onset of generalized abdominal pain with nausea, vomiting, diarrhea onset 2 hours prior to arrival.  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  She denies fevers, chills.  No abdominal surgeries.  Denies blood in emesis or stools.  No other complaints or concerns.       Home Medications Prior to Admission medications   Medication Sig Start Date End Date Taking? Authorizing Provider  amoxicillin (AMOXIL) 500 MG capsule Take 2 capsules (1,000 mg total) by mouth 3 (three) times daily for 5 days. 03/02/23 03/07/23 Yes Jeannie Fend, PA-C  ibuprofen (ADVIL) 200 MG tablet Take 200 mg by mouth every 6 (six) hours as needed for fever, headache or mild pain.   Yes [provider]  ondansetron (ZOFRAN-ODT) 4 MG disintegrating tablet Take 1 tablet (4 mg total) by mouth every 8 (eight) hours as needed for nausea or vomiting. 03/02/23  Yes Jeannie Fend, PA-C      Allergies    Patient has no known allergies.    Review of Systems   Review of Systems Negative except as per HPI Physical Exam Updated Vital Signs BP 103/70   Pulse 90   Temp 97.7 F (36.5 C) (Oral)   Resp 16   Ht 5\' 4"  (1.626 m)   Wt 45.4 kg   LMP  (LMP Unknown) Comment: negative HCG 03/01/23  SpO2 98%   BMI 17.16 kg/m  Physical Exam Vitals and nursing note reviewed.  Constitutional:      General: She is not in acute distress.    Appearance: She is well-developed. She is not diaphoretic.  HENT:     Head: Normocephalic and atraumatic.  Cardiovascular:     Rate and Rhythm: Normal rate and regular rhythm.     Heart sounds: Normal  heart sounds.  Pulmonary:     Effort: Pulmonary effort is normal.     Breath sounds: Normal breath sounds.  Abdominal:     General: Bowel sounds are normal.     Palpations: Abdomen is soft.     Tenderness: There is no abdominal tenderness.  Skin:    General: Skin is warm and dry.     Findings: No erythema or rash.  Neurological:     Mental Status: She is alert and oriented to person, place, and time.  Psychiatric:        Behavior: Behavior normal.     ED Results / Procedures / Treatments   Labs (all labs ordered are listed, but only abnormal results are displayed) Labs Reviewed  URINALYSIS, ROUTINE W REFLEX MICROSCOPIC - Abnormal; Notable for the following components:      Result Value   APPearance HAZY (*)    Hgb urine dipstick LARGE (*)    Protein, ur 30 (*)    Bacteria, UA RARE (*)    All other components within normal limits  CBC WITH DIFFERENTIAL/PLATELET - Abnormal; Notable for the following components:   WBC 19.7 (*)    Neutro Abs 17.5 (*)    Abs Immature Granulocytes 0.08 (*)    All other components within normal limits  COMPREHENSIVE METABOLIC PANEL - Abnormal; Notable for the following components:   CO2 21 (*)    Glucose, Bld 118 (*)    All other components within normal limits  HCG, SERUM, QUALITATIVE  LIPASE, BLOOD    EKG None  Radiology CT ABDOMEN PELVIS W CONTRAST  Result Date: 03/02/2023 CLINICAL DATA:  Acute abdominal pain for several hours, initial encounter EXAM: CT ABDOMEN AND PELVIS WITH CONTRAST TECHNIQUE: Multidetector CT imaging of the abdomen and pelvis was performed using the standard protocol following bolus administration of intravenous contrast. RADIATION DOSE REDUCTION: This exam was performed according to the departmental dose-optimization program which includes automated exposure control, adjustment of the mA and/or kV according to patient size and/or use of iterative reconstruction technique. CONTRAST:  80mL OMNIPAQUE IOHEXOL 300 MG/ML   SOLN COMPARISON:  None Available. FINDINGS: Lower chest: Lung bases show some very mild airspace opacity within the right middle lobe consistent with early infiltrate. No sizable effusion is noted. Hepatobiliary: No focal liver abnormality is seen. No gallstones, gallbladder wall thickening, or biliary dilatation. Pancreas: Unremarkable. No pancreatic ductal dilatation or surrounding inflammatory changes. Spleen: Normal in size without focal abnormality. Adrenals/Urinary Tract: Adrenal glands are within normal limits. Kidneys show normal enhancement pattern bilaterally. No renal calculi or obstructive changes are noted. The bladder is partially distended. Stomach/Bowel: Fluid is noted throughout the colon without obstructive change. Small bowel and stomach are unremarkable. Vascular/Lymphatic: No significant vascular findings are present. No enlarged abdominal or pelvic lymph nodes. Reproductive: Uterus is mildly retroflexed with multiple uterine fibroids identified. No adnexal mass is seen. Other: No abdominal wall hernia or abnormality. No abdominopelvic ascites. Musculoskeletal: No acute or significant osseous findings. IMPRESSION: Uterine fibroid change. Early infiltrate in the right middle lobe. No other focal abnormality is noted. Electronically Signed   By: Alcide Clever M.D.   On: 03/02/2023 01:49    Procedures Procedures    Medications Ordered in ED Medications  ondansetron (ZOFRAN-ODT) disintegrating tablet 4 mg (4 mg Oral Given 03/01/23 2244)  sodium chloride 0.9 % bolus 1,000 mL (0 mLs Intravenous Stopped 03/02/23 0247)  morphine (PF) 4 MG/ML injection 4 mg (4 mg Intravenous Given 03/02/23 0025)  iohexol (OMNIPAQUE) 300 MG/ML solution 80 mL (80 mLs Intravenous Contrast Given 03/02/23 0126)  cefTRIAXone (ROCEPHIN) 1 g in sodium chloride 0.9 % 100 mL IVPB (0 g Intravenous Stopped 03/02/23 0247)    ED Course/ Medical Decision Making/ A&P                                 Medical Decision  Making Amount and/or Complexity of Data Reviewed Labs: ordered. Radiology: ordered.  Risk Prescription drug management.   This patient presents to the ED for concern of abdominal pain, this involves an extensive number of treatment options, and is a complaint that carries with it a high risk of complications and morbidity.  The differential diagnosis includes but not limited to appendicitis, gastritis, gastroenteritis, pancreatitis, acute cholecystitis, ectopic, torsion   Co morbidities that complicate the patient evaluation  Otherwise healthy   Additional history obtained:  Additional history obtained from EMS as per HPI External records from outside source obtained and reviewed including prior labs on file for comparison   Lab Tests:  I Ordered, and personally interpreted labs.  The pertinent results include: CBC with WBC 19.7 with increase in neutrophils.  CMP without significant findings.  Lipase normal.  Urinalysis with rare bacteria, protein and hemoglobin,  negative for leukocytes and nitrates.  hCG is negative.   Imaging Studies ordered:  I ordered imaging studies including CT abdomen pelvis I independently visualized and interpreted imaging which showed no acute findings in the abdomen to explain patient's symptoms today I agree with the radiologist interpretation, developing right lung infiltrate    Problem List / ED Course / Critical interventions / Medication management  37 year old female brought in by EMS with sudden onset of vomiting and diarrhea with abdominal discomfort.  Found to have elevated white count at 19.7, she is afebrile.  Abdomen pelvis imaging is unremarkable however it does reveal potential for right lung infiltrate.  Patient mentions that she has had a cough off and on since May, has been to her PCP and told she had bronchitis.  Notes that she feels chills at times but has not checked her temperature.  Plan is to treat with antibiotics and recommend  recheck with PCP.  Advised return to ER for any worsening or concerning symptoms. I ordered medication including morphine, Zofran, IV fluids and Rocephin for pain, nausea, pneumonia Reevaluation of the patient after these medicines showed that the patient improved I have reviewed the patients home medicines and have made adjustments as needed   Social Determinants of Health:  Has PCP not on file   Test / Admission - Considered:  Stable for discharge with plan to recheck with PCP, return to ER precautions provided.         Final Clinical Impression(s) / ED Diagnoses Final diagnoses:  Generalized abdominal pain  Nausea and vomiting, unspecified vomiting type  Diarrhea, unspecified type  Pneumonia of right lung due to infectious organism, unspecified part of lung    Rx / DC Orders ED Discharge Orders          Ordered    amoxicillin (AMOXIL) 500 MG capsule  3 times daily        03/02/23 0208    ondansetron (ZOFRAN-ODT) 4 MG disintegrating tablet  Every 8 hours PRN        03/02/23 0243              Jeannie Fend, PA-C 03/02/23 0504    Dione Booze, MD 03/02/23 [phone]

## 2023-03-02 NOTE — Discharge Instructions (Addendum)
Follow up with your primary care provider this week for recheck. Take antibiotics as prescribed.  Take Zofran for nausea and vomiting as prescribed.  Return to the ER for worsening or concerning symptoms

## 2023-04-05 ENCOUNTER — Encounter: Payer: Self-pay | Admitting: Pulmonary Disease

## 2023-10-09 ENCOUNTER — Ambulatory Visit (HOSPITAL_COMMUNITY)
Admission: EM | Admit: 2023-10-09 | Discharge: 2023-10-09 | Disposition: A | Discharge status: Home / Self Care | Attending: Emergency Medicine | Admitting: Emergency Medicine

## 2023-10-09 ENCOUNTER — Other Ambulatory Visit: Payer: Self-pay

## 2023-10-09 ENCOUNTER — Encounter (HOSPITAL_COMMUNITY): Payer: Self-pay | Admitting: Emergency Medicine

## 2023-10-09 ENCOUNTER — Telehealth (HOSPITAL_COMMUNITY): Payer: Self-pay

## 2023-10-09 DIAGNOSIS — R110 Nausea: Secondary | ICD-10-CM

## 2023-10-09 DIAGNOSIS — J00 Acute nasopharyngitis [common cold]: Secondary | ICD-10-CM

## 2023-10-09 DIAGNOSIS — J392 Other diseases of pharynx: Secondary | ICD-10-CM

## 2023-10-09 LAB — POC COVID19/FLU A&B COMBO
Covid Antigen, POC: NEGATIVE
Influenza A Antigen, POC: NEGATIVE
Influenza B Antigen, POC: NEGATIVE

## 2023-10-09 MED ORDER — ONDANSETRON 4 MG PO TBDP: 4.0000 mg | ORAL_TABLET | Freq: Three times a day (TID) | ORAL | 0 refills | Status: AC | PRN

## 2023-10-09 MED ORDER — FLUTICASONE PROPIONATE 50 MCG/ACT NA SUSP: 1.0000 | Freq: Every day | NASAL | 2 refills | Status: AC

## 2023-10-09 NOTE — ED Provider Notes (Incomplete)
 [facility]    CSN: [id number] Arrival date & time: 10/09/23  1436    HISTORY   Chief Complaint  Patient presents with   URI   HPI Anna Wolfe is a pleasant, 38 y.o. female who presents to urgent care today. Patient complains of 4-day history of nasal congestion, rhinorrhea, sneezing, nonproductive cough and dry throat.  Patient has normal VS on arrival.  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  Patient states she is also been feeling nauseated and is requesting a refill of Zofran which she states has been prescribed for her in the past and works very well.  Patient denies vomiting, diarrhea, abdominal pain, headache, fever, body aches, chills.  The history is provided by the patient.   Past Medical History:  Diagnosis Date   No pertinent past medical history    Patient Active Problem List   Diagnosis Date Noted   Laceration of right little finger without foreign body without damage to nail 05/06/2020   Ectopic pregnancy 06/06/2011   Past Surgical History:  Procedure Laterality Date   HAND SURGERY     NO PAST SURGERIES     OB History     Gravida  1   Para      Term      Preterm      AB      Living         SAB      IAB      Ectopic      Multiple      Live Births             Home Medications    Prior to Admission medications   Medication Sig Start Date End Date Taking? Authorizing Provider  ibuprofen (ADVIL) 200 MG tablet Take 200 mg by mouth every 6 (six) hours as needed for fever, headache or mild pain.    [provider]  ondansetron (ZOFRAN-ODT) 4 MG disintegrating tablet Take 1 tablet (4 mg total) by mouth every 8 (eight) hours as needed for nausea or vomiting. 03/02/23   Jeannie Fend, PA-C    Family History Family History  Problem Relation Age of Onset   Healthy Mother    Anesthesia problems Neg Hx    Social History Social History   Tobacco Use   Smoking status: Never    Smokeless tobacco: Never  Substance Use Topics   Alcohol use: No    Comment: once a year   Drug use: No   Allergies   Patient has no known allergies.  Review of Systems Review of Systems Pertinent findings revealed after performing a 14 point review of systems has been noted in the history of present illness.  Physical Exam Vital Signs BP 135/85 (BP Location: Right Arm)   Pulse 100   Temp 98 F (36.7 C) (Oral)   Resp 18   LMP 09/18/2023 (Approximate)   SpO2 100%   No data found.  Physical Exam Vitals and nursing note reviewed.  Constitutional:      General: She is not in acute distress.    Appearance: Normal appearance. She is not ill-appearing.  HENT:     Head: Normocephalic and atraumatic.     Salivary Glands: Right salivary gland is not diffusely enlarged or tender. Left salivary gland is not diffusely enlarged or tender.     Right Ear: Ear canal and external ear normal. No drainage. A middle ear effusion is present. There is no  impacted cerumen. Tympanic membrane is bulging. Tympanic membrane is not injected or erythematous.     Left Ear: Ear canal and external ear normal. No drainage. A middle ear effusion is present. There is no impacted cerumen. Tympanic membrane is bulging. Tympanic membrane is not injected or erythematous.     Ears:     Comments: Bilateral EACs normal, both TMs bulging with clear fluid    Nose: Rhinorrhea present. No nasal deformity, septal deviation, signs of injury, nasal tenderness, mucosal edema or congestion. Rhinorrhea is clear.     Right Nostril: Occlusion present. No foreign body, epistaxis or septal hematoma.     Left Nostril: Occlusion present. No foreign body, epistaxis or septal hematoma.     Right Turbinates: Enlarged, swollen and pale.     Left Turbinates: Enlarged, swollen and pale.     Right Sinus: No maxillary sinus tenderness or frontal sinus tenderness.     Left Sinus: No maxillary sinus tenderness or frontal sinus tenderness.      Mouth/Throat:     Lips: Pink. No lesions.     Mouth: Mucous membranes are moist. No oral lesions.     Pharynx: Oropharynx is clear. Uvula midline. No posterior oropharyngeal erythema or uvula swelling.     Tonsils: No tonsillar exudate. 0 on the right. 0 on the left.     Comments: Postnasal drip Eyes:     General: Lids are normal.        Right eye: No discharge.        Left eye: No discharge.     Extraocular Movements: Extraocular movements intact.     Conjunctiva/sclera: Conjunctivae normal.     Right eye: Right conjunctiva is not injected.     Left eye: Left conjunctiva is not injected.  Neck:     Trachea: Trachea and phonation normal.  Cardiovascular:     Rate and Rhythm: Normal rate and regular rhythm.     Pulses: Normal pulses.     Heart sounds: Normal heart sounds. No murmur heard.    No friction rub. No gallop.  Pulmonary:     Effort: Pulmonary effort is normal. No accessory muscle usage, prolonged expiration or respiratory distress.     Breath sounds: Normal breath sounds. No stridor, decreased air movement or transmitted upper airway sounds. No decreased breath sounds, wheezing, rhonchi or rales.  Chest:     Chest wall: No tenderness.  Musculoskeletal:        General: Normal range of motion.     Cervical back: Normal range of motion and neck supple. Normal range of motion.  Lymphadenopathy:     Cervical: No cervical adenopathy.  Skin:    General: Skin is warm and dry.     Findings: No erythema or rash.  Neurological:     General: No focal deficit present.     Mental Status: She is alert and oriented to person, place, and time.  Psychiatric:        Mood and Affect: Mood normal.        Behavior: Behavior normal.     Visual Acuity Right Eye Distance:   Left Eye Distance:   Bilateral Distance:    Right Eye Near:   Left Eye Near:    Bilateral Near:     UC Couse / Diagnostics / Procedures:     Radiology No results found.  Procedures Procedures  (including critical care time) EKG  Pending results:  Labs Reviewed  POC COVID19/FLU A&B COMBO  Medications Ordered in UC: Medications - No data to display  UC Diagnoses / Final Clinical Impressions(s)   I have reviewed the triage vital signs and the nursing notes.  Pertinent labs & imaging results that were available during my care of the patient were reviewed by me and considered in my medical decision making (see chart for details).    Final diagnoses:  Acute rhinitis  Throat dryness  Nausea without vomiting   Patient advised that physical exam findings concerning for acute rhinitis most likely allergic in etiology.  Patient advised COVID-19 and influenza tests were all negative.  Recommend Flonase for treatment.  Patient provided with a courtesy refill of Zofran for her reported nausea.  Return precautions advised.  Please see discharge instructions below for details of plan of care as provided to patient. ED Prescriptions     Medication Sig Dispense Auth. Provider   fluticasone (FLONASE) 50 MCG/ACT nasal spray Place 1 spray into both nostrils daily. Begin by using 2 sprays in each nare daily for 3 to 5 days, then decrease to 1 spray in each nare daily. 15.8 mL Theadora Rama Scales, PA-C   ondansetron (ZOFRAN-ODT) 4 MG disintegrating tablet Take 1 tablet (4 mg total) by mouth every 8 (eight) hours as needed for nausea or vomiting. 20 tablet Theadora Rama Scales, PA-C      PDMP not reviewed this encounter.  Pending results:  Labs Reviewed  POC COVID19/FLU A&B COMBO      Discharge Instructions      Your rapid influenza antigen test today was negative.  No further influenza testing is indicated.     Your COVID-19 test is negative.  Please consider retesting in the next 2 to 3 days, particularly if you are not feeling any better.  You are welcome to return here to urgent care to have it done or you can take a home COVID-19 test.     If both your COVID-19 tests  are negative, then you can safely assume that your illness is due to one of the many less serious illnesses circulating in our community right now.     Conservative care is recommended with rest, drinking plenty of clear fluids, eating only when hungry, taking supportive medications for your symptoms and avoiding being around other people.  Please remain at home until you are fever free for 24 hours without the use of antifever medications such as Tylenol and ibuprofen.   Please read below to learn more about the medications, dosages and frequencies that I recommend to help alleviate your symptoms and to get you feeling better soon:   Flonase (fluticasone): This is a steroid nasal spray that used once daily, 1 spray in each nare.  This works best when used on a daily basis. This medication does not work well if it is only used when you think you need it.  After 3 to 5 days of use, you will notice significant reduction of the inflammation and mucus production that is currently being caused by exposure to allergens, whether seasonal or environmental.  The most common side effect of this medication is nosebleeds.  If you experience a nosebleed, please discontinue use for 1 week, then feel free to resume.  If you find that your insurance will not pay for this medication, please consider a different nasal steroids such as Nasonex (mometasone), or Nasacort (triamcinolone).   Zofran (ondansetron): This is a good antinausea medication that you can take every 8 hours as needed for symptoms of nausea  and vomiting.  It is a dissolvable tablet that you do not need to take with water, just put it under your tongue and let it melt away.  I have sent a prescription to your pharmacy.   If symptoms have not meaningfully improved in the next 5 to 7 days, please return for repeat evaluation or follow-up with your regular provider.  If symptoms have worsened in the next 3 to 5 days, please go to the emergency room for further  evaluation.    Thank you for visiting urgent care today.  We appreciate the opportunity to participate in your care.       Disposition Upon Discharge:  Condition: stable for discharge home  Patient presented with an acute illness with associated systemic symptoms and significant discomfort requiring urgent management. In my opinion, this is a condition that a prudent lay person (someone who possesses an average knowledge of health and medicine) may potentially expect to result in complications if not addressed urgently such as respiratory distress, impairment of bodily function or dysfunction of bodily organs.   Routine symptom specific, illness specific and/or disease specific instructions were discussed with the patient and/or caregiver at length.   As such, the patient has been evaluated and assessed, work-up was performed and treatment was provided in alignment with urgent care protocols and evidence based medicine.  Patient/parent/caregiver has been advised that the patient may require follow up for further testing and treatment if the symptoms continue in spite of treatment, as clinically indicated and appropriate.  Patient/parent/caregiver has been advised to return to the [facility] or PCP if no better; to PCP or the Emergency Department if new signs and symptoms develop, or if the current signs or symptoms continue to change or worsen for further workup, evaluation and treatment as clinically indicated and appropriate  The patient will follow up with their current PCP if and as advised. If the patient does not currently have a PCP we will assist them in obtaining one.   The patient may need specialty follow up if the symptoms continue, in spite of conservative treatment and management, for further workup, evaluation, consultation and treatment as clinically indicated and appropriate.  Patient/parent/caregiver verbalized understanding and agreement of plan as discussed.  All questions were  addressed during visit.  Please see discharge instructions below for further details of plan.  This office note has been dictated using Teaching laboratory technician.  Unfortunately, this method of dictation can sometimes lead to typographical or grammatical errors.  I apologize for your inconvenience in advance if this occurs.  Please do not hesitate to reach out to me if clarification is needed.      Theadora Rama Scales, PA-C 10/10/23 1750    Theadora Rama Scales, PA-C 10/10/23 1751

## 2023-10-09 NOTE — ED Triage Notes (Signed)
 Cold like symptoms for the past 4 days with chest congestion, cough and sore throat.

## 2023-10-09 NOTE — Discharge Instructions (Addendum)
 Your rapid influenza antigen test today was negative.  No further influenza testing is indicated.     Your COVID-19 test is negative.  Please consider retesting in the next 2 to 3 days, particularly if you are not feeling any better.  You are welcome to return here to urgent care to have it done or you can take a home COVID-19 test.     If both your COVID-19 tests are negative, then you can safely assume that your illness is due to one of the many less serious illnesses circulating in our community right now.     Conservative care is recommended with rest, drinking plenty of clear fluids, eating only when hungry, taking supportive medications for your symptoms and avoiding being around other people.  Please remain at home until you are fever free for 24 hours without the use of antifever medications such as Tylenol and ibuprofen.   Please read below to learn more about the medications, dosages and frequencies that I recommend to help alleviate your symptoms and to get you feeling better soon:   Flonase (fluticasone): This is a steroid nasal spray that used once daily, 1 spray in each nare.  This works best when used on a daily basis. This medication does not work well if it is only used when you think you need it.  After 3 to 5 days of use, you will notice significant reduction of the inflammation and mucus production that is currently being caused by exposure to allergens, whether seasonal or environmental.  The most common side effect of this medication is nosebleeds.  If you experience a nosebleed, please discontinue use for 1 week, then feel free to resume.  If you find that your insurance will not pay for this medication, please consider a different nasal steroids such as Nasonex (mometasone), or Nasacort (triamcinolone).   Zofran (ondansetron): This is a good antinausea medication that you can take every 8 hours as needed for symptoms of nausea and vomiting.  It is a dissolvable tablet that you do  not need to take with water, just put it under your tongue and let it melt away.  I have sent a prescription to your pharmacy.   If symptoms have not meaningfully improved in the next 5 to 7 days, please return for repeat evaluation or follow-up with your regular provider.  If symptoms have worsened in the next 3 to 5 days, please go to the emergency room for further evaluation.    Thank you for visiting urgent care today.  We appreciate the opportunity to participate in your care.

## 2024-05-15 ENCOUNTER — Encounter (HOSPITAL_COMMUNITY): Payer: Self-pay

## 2024-05-15 ENCOUNTER — Ambulatory Visit (HOSPITAL_COMMUNITY)
Admission: EM | Admit: 2024-05-15 | Discharge: 2024-05-15 | Disposition: A | Discharge status: Home / Self Care | Payer: Self-pay

## 2024-05-15 DIAGNOSIS — J069 Acute upper respiratory infection, unspecified: Secondary | ICD-10-CM

## 2024-05-15 NOTE — Discharge Instructions (Signed)

## 2024-05-15 NOTE — ED Provider Notes (Addendum)
 [facility]    CSN: [id number] Arrival date & time: 05/15/24  9052      History   Chief Complaint Chief Complaint  Patient presents with   Cough   Nasal Congestion   Sore Throat    HPI Anna Wolfe is a 38 y.o. female.   Patient presents today due to nasal congestion, sneezing, cough, and chills for the past 4 days.  Patient denies fever, shortness of breath, nausea, vomiting, or significant changes in appetite.  Patient has been taking over-the-counter cold medication with mild relief of symptoms.    The history is provided by the patient.  Cough Sore Throat    Past Medical History:  Diagnosis Date   No pertinent past medical history     Patient Active Problem List   Diagnosis Date Noted   Laceration of right little finger without foreign body without damage to nail 05/06/2020   Ectopic pregnancy 06/06/2011    Past Surgical History:  Procedure Laterality Date   HAND SURGERY     NO PAST SURGERIES      OB History     Gravida  1   Para      Term      Preterm      AB      Living         SAB      IAB      Ectopic      Multiple      Live Births               Home Medications    Prior to Admission medications   Medication Sig Start Date End Date Taking? Authorizing Provider  fluticasone  (FLONASE ) 50 MCG/ACT nasal spray Place 1 spray into both nostrils daily. Begin by using 2 sprays in each nare daily for 3 to 5 days, then decrease to 1 spray in each nare daily. Patient not taking: Reported on 05/15/2024 10/09/23   Joesph Shaver Scales, PA-C  ondansetron  (ZOFRAN -ODT) 4 MG disintegrating tablet Take 1 tablet (4 mg total) by mouth every 8 (eight) hours as needed for nausea or vomiting. Patient not taking: Reported on 05/15/2024 10/09/23   Joesph Shaver Scales, PA-C    Family History Family History  Problem Relation Age of Onset   Healthy Mother    Anesthesia problems Neg Hx     Social History Social History    Tobacco Use   Smoking status: Never   Smokeless tobacco: Never  Vaping Use   Vaping status: Never Used  Substance Use Topics   Alcohol use: No    Comment: once a year   Drug use: No     Allergies   Patient has no known allergies.   Review of Systems Review of Systems  Respiratory:  Positive for cough.      Physical Exam Triage Vital Signs ED Triage Vitals  Encounter Vitals Group     BP 05/15/24 1105 132/86     Girls Systolic BP Percentile --      Girls Diastolic BP Percentile --      Boys Systolic BP Percentile --      Boys Diastolic BP Percentile --      Pulse Rate 05/15/24 1105 98     Resp 05/15/24 1105 14     Temp 05/15/24 1105 98.4 F (36.9 C)     Temp Source 05/15/24 1105 Oral     SpO2 05/15/24 1105 98 %     Weight --  Height --      Head Circumference --      Peak Flow --      Pain Score 05/15/24 1104 6     Pain Loc --      Pain Education --      Exclude from Growth Chart --    No data found.  Updated Vital Signs BP 132/86 (BP Location: Right Arm)   Pulse 98   Temp 98.4 F (36.9 C) (Oral)   Resp 14   LMP 04/20/2024 (Exact Date)   SpO2 98%   Visual Acuity Right Eye Distance:   Left Eye Distance:   Bilateral Distance:    Right Eye Near:   Left Eye Near:    Bilateral Near:     Physical Exam Vitals and nursing note reviewed.  Constitutional:      General: She is not in acute distress.    Appearance: Normal appearance. She is not ill-appearing, toxic-appearing or diaphoretic.  HENT:     Nose: Congestion (moderately enlarged turbinates) present. No rhinorrhea.     Mouth/Throat:     Mouth: Mucous membranes are moist.     Pharynx: Oropharynx is clear. No oropharyngeal exudate or posterior oropharyngeal erythema.  Eyes:     General: No scleral icterus. Cardiovascular:     Rate and Rhythm: Normal rate and regular rhythm.     Heart sounds: Normal heart sounds.  Pulmonary:     Effort: Pulmonary effort is normal. No respiratory  distress.     Breath sounds: Normal breath sounds. No wheezing or rhonchi.  Skin:    General: Skin is warm.  Neurological:     Mental Status: She is alert and oriented to person, place, and time.  Psychiatric:        Mood and Affect: Mood normal.        Behavior: Behavior normal.      UC Treatments / Results  Labs (all labs ordered are listed, but only abnormal results are displayed) Labs Reviewed - No data to display  EKG   Radiology No results found.  Procedures Procedures (including critical care time)  Medications Ordered in UC Medications - No data to display  Initial Impression / Assessment and Plan / UC Course  I have reviewed the triage vital signs and the nursing notes.  Pertinent labs & imaging results that were available during my care of the patient were reviewed by me and considered in my medical decision making (see chart for details).     Patient was diagnosed with a viral URI and given supportive treatment for viral illness. Final Clinical Impressions(s) / UC Diagnoses   Final diagnoses:  Viral URI     Discharge Instructions      You been diagnosed with a viral illness today. -Viruses have to run their course and medicines that are prescribed are meant to help with symptoms. - With viruses usually feel poorly from 3 to 7 days with cough being the last symptoms to resolve.  -Cough can linger from days to weeks.  Antibiotics are not effective for viruses. -If your cough lasts more than 2 weeks and you are coughing so hard that you are vomiting or feel like you could pass out we need to follow-up with PCP for further testing and evaluation. -Rest, increase water intake, may use pseudoephedrine for nasal congestion, Delsym (dextromethorphan) or honey as needed for cough, and ibuprofen and/or Tylenol as directed on packaging for pain and fever. -If you have hypertension you should take  Coricidin or other OTC meds approved for people with high blood  pressure. -You may use a spoonful of honey every 4-6 hours as needed for throat pain and cough. -Warm tea with honey and lemon are helpful for soothe throat as well.  Chloraseptic and Cepacol make a throat lozenge with numbing medication, can be purchased over-the-counter. -May also use Flonase  or sinus rinse for sinus pressure or nasal congestion.  Be sure to use distilled bottled water for sinus rinses. -May use coolmist humidifier to open up nasal passages -May elevate head to assist with postnasal drainage. -If you feel poorly (fever, fatigue, shortness of breath, nausea, etc.) for more than 10 days to be sure to follow-up with PCP or in clinic for further evaluation and additional treatments. If you experience chest pain with shortness of breath or pulse oxygen less than 95% you should report to the ER.     ED Prescriptions   None    PDMP not reviewed this encounter.   Andra Corean BROCKS, PA-C 05/15/24 1156    Andra Corean BROCKS, PA-C 05/15/24 1157

## 2024-05-15 NOTE — ED Triage Notes (Addendum)
 Patient reports that she has had a non productive cough, sore throat, chills, nasal congestion, and sneezing x 4 days.  Patient states she has been taking Ibuprofen and Mucinex for her symptoms.
